# Patient Record
Sex: Female | Born: 1975
Health system: Southern US, Community
[De-identification: ages and names within clinical notes are randomized; demographics above are authoritative.]

## PROBLEM LIST (undated history)

## (undated) DIAGNOSIS — M545 Low back pain, unspecified: Secondary | ICD-10-CM

## (undated) DIAGNOSIS — A630 Anogenital (venereal) warts: Secondary | ICD-10-CM

## (undated) DIAGNOSIS — N200 Calculus of kidney: Secondary | ICD-10-CM

## (undated) DIAGNOSIS — E785 Hyperlipidemia, unspecified: Secondary | ICD-10-CM

## (undated) DIAGNOSIS — T7840XA Allergy, unspecified, initial encounter: Secondary | ICD-10-CM

## (undated) HISTORY — DX: Allergy, unspecified, initial encounter: T78.40XA

## (undated) HISTORY — DX: Low back pain: M54.5

## (undated) HISTORY — DX: Hyperlipidemia, unspecified: E78.5

## (undated) HISTORY — DX: Calculus of kidney: N20.0

## (undated) HISTORY — DX: Low back pain, unspecified: M54.50

## (undated) HISTORY — DX: Anogenital (venereal) warts: A63.0

---

## 1997-10-27 HISTORY — PX: OTHER SURGICAL HISTORY: SHX169

## 1999-05-06 ENCOUNTER — Emergency Department (HOSPITAL_COMMUNITY): Admission: EM | Admit: 1999-05-06 | Discharge: 1999-05-06 | Payer: Self-pay | Admitting: Emergency Medicine

## 2004-09-30 ENCOUNTER — Other Ambulatory Visit: Admission: RE | Admit: 2004-09-30 | Discharge: 2004-09-30 | Payer: Self-pay | Admitting: Obstetrics and Gynecology

## 2004-11-11 ENCOUNTER — Encounter: Admission: RE | Admit: 2004-11-11 | Discharge: 2004-11-11 | Payer: Self-pay | Admitting: Internal Medicine

## 2005-10-14 ENCOUNTER — Other Ambulatory Visit: Admission: RE | Admit: 2005-10-14 | Discharge: 2005-10-14 | Payer: Self-pay | Admitting: Obstetrics and Gynecology

## 2005-10-27 HISTORY — PX: OTHER SURGICAL HISTORY: SHX169

## 2006-08-07 ENCOUNTER — Ambulatory Visit (HOSPITAL_COMMUNITY): Admission: RE | Admit: 2006-08-07 | Discharge: 2006-08-07 | Payer: Self-pay | Admitting: Obstetrics and Gynecology

## 2006-08-07 ENCOUNTER — Encounter (INDEPENDENT_AMBULATORY_CARE_PROVIDER_SITE_OTHER): Payer: Self-pay | Admitting: Specialist

## 2006-12-15 ENCOUNTER — Ambulatory Visit: Admission: RE | Admit: 2006-12-15 | Discharge: 2006-12-15 | Payer: Self-pay | Admitting: Gynecology

## 2007-01-13 ENCOUNTER — Ambulatory Visit (HOSPITAL_BASED_OUTPATIENT_CLINIC_OR_DEPARTMENT_OTHER): Admission: RE | Admit: 2007-01-13 | Discharge: 2007-01-13 | Payer: Self-pay | Admitting: Gynecology

## 2007-02-09 ENCOUNTER — Ambulatory Visit: Admission: RE | Admit: 2007-02-09 | Discharge: 2007-02-09 | Payer: Self-pay | Admitting: Gynecology

## 2007-04-23 ENCOUNTER — Ambulatory Visit: Admission: RE | Admit: 2007-04-23 | Discharge: 2007-04-23 | Payer: Self-pay | Admitting: Gynecology

## 2008-11-13 ENCOUNTER — Ambulatory Visit: Payer: Self-pay | Admitting: Sports Medicine

## 2008-11-13 DIAGNOSIS — M79609 Pain in unspecified limb: Secondary | ICD-10-CM

## 2008-12-18 ENCOUNTER — Ambulatory Visit: Payer: Self-pay | Admitting: Sports Medicine

## 2009-05-18 ENCOUNTER — Ambulatory Visit: Payer: Self-pay | Admitting: Internal Medicine

## 2009-09-06 ENCOUNTER — Ambulatory Visit: Payer: Self-pay | Admitting: Internal Medicine

## 2009-09-25 ENCOUNTER — Ambulatory Visit: Payer: Self-pay | Admitting: Internal Medicine

## 2009-11-29 ENCOUNTER — Ambulatory Visit: Payer: Self-pay | Admitting: Internal Medicine

## 2009-12-21 ENCOUNTER — Ambulatory Visit: Payer: Self-pay | Admitting: Internal Medicine

## 2010-06-10 ENCOUNTER — Ambulatory Visit: Payer: Self-pay | Admitting: Internal Medicine

## 2010-07-25 ENCOUNTER — Ambulatory Visit: Payer: Self-pay | Admitting: Internal Medicine

## 2011-01-21 ENCOUNTER — Other Ambulatory Visit: Payer: Self-pay | Admitting: Internal Medicine

## 2011-01-23 ENCOUNTER — Ambulatory Visit (INDEPENDENT_AMBULATORY_CARE_PROVIDER_SITE_OTHER): Payer: BC Managed Care – PPO | Admitting: Internal Medicine

## 2011-01-23 DIAGNOSIS — K5900 Constipation, unspecified: Secondary | ICD-10-CM

## 2011-01-23 DIAGNOSIS — E785 Hyperlipidemia, unspecified: Secondary | ICD-10-CM

## 2011-03-11 NOTE — Consult Note (Signed)
NAMEJAMARIYA, [name] NO.:  [id number]   MEDICAL RECORD NO.:  [id number]          PATIENT TYPE:  OUT   LOCATION:  GYN                          FACILITY:  [facility]   PHYSICIAN:  De Blanch, M.D.DATE OF BIRTH:  1975/12/27   DATE OF CONSULTATION:  DATE OF DISCHARGE:                                 CONSULTATION   CHIEF COMPLAINT:  Vulvar dysplasia.   INTERVAL HISTORY:  The patient is seen today for continued followup.  She has now had complete healing of her vulvar laser vaporization  initially performed January 13, 2007.  She has not noted any new lesions.  She specifically denies any pruritus  or thickened skin or lumps.   The patient underwent CO2 laser vaporization of the posterior vulva for  severe dysplasia on January 13, 2007.   PAST SURGICAL HISTORY:  Laser vaporization of the vulva; wisdom teeth  extraction.   CURRENT MEDICATIONS:  None.   DRUG ALLERGIES:  None.   SOCIAL HISTORY:  The patient is a first grade teacher.  She does not  smoke.   OBSTETRICAL HISTORY:  Gravida 0.   FAMILY HISTORY:  Negative for gynecologic, breast or colon cancer.   PHYSICAL EXAMINATION:  Weight 175 pounds. Blood pressure 118/68.  GENERAL:  The patient is a healthy young woman in no acute distress.  HEENT:  Negative.  NECK:  Supple without thyromegaly.  LYMPH NODES:  There is no supraclavicular or inguinal adenopathy.  ABDOMEN:  Soft, nontender, no mass, organomegaly, ascites or hernias  noted.  PELVIC EXAM:  EGBUS shows complete healing of the vulvar laser  procedure.  There is no significant scar present. Vagina is otherwise  normal as is the cervix.  Uterus is anterior, normal shape, size,  consistency.  There are no adnexal masses noted.   IMPRESSION:  Excellent healing following laser vaporization for severe  dysplasia of the vulva. There is no evidence for recurrent disease.   PLAN:  I would recommend the patient return to the care of Dr. Normand Sloop  and  would suggest that she have pelvic exams and Pap smears at 6 month  intervals.  She is warned regarding the potential symptoms and that she  should report sooner.      De Blanch, M.D.  Electronically Signed     DC/MEDQ  D:  04/23/2007  T:  04/23/2007  Job:  [id number]   cc:   Naima A. Normand Sloop, M.D.  Fax: [phone]   Telford Nab, R.N.  [address]  [place], [place] [address]

## 2011-03-14 NOTE — H&P (Signed)
NAMESAVITA, RUNNER NO.:  [id number]   MEDICAL RECORD NO.:  [id number]         PATIENT TYPE:  LOUT   LOCATION:                               FACILITY:  [facility]   PHYSICIAN:  De Blanch, M.D.DATE OF BIRTH:  02-27-1976   DATE OF ADMISSION:  DATE OF DISCHARGE:                              HISTORY & PHYSICAL   CHIEF COMPLAINT:  Vulvar dysplasia.   HISTORY:  The patient underwent laser vaporization of a posterior vulvar  lesion on March 19.  She reports she has had no difficulties since that  time.   PERTINENT EXAMINATION:  The vulvar exam shows that the perineal area  that was lasered is about 80% healed.  There is still a section in the  center of the wound that is granulating.  This appears clean, healthy  and with no evidence of infection.   RECOMMENDATIONS:  The patient will continue to keep this area clean;  applying Silvadene cream twice a day.  She is to return to see me in 2  months for follow-up.      De Blanch, M.D.  Electronically Signed     DC/MEDQ  D:  02/09/2007  T:  02/10/2007  Job:  [id number]

## 2011-03-14 NOTE — Op Note (Signed)
[name], [name] NO.:  Savage   MEDICAL RECORD NO.:  [id number]          PATIENT TYPE:  AMB   LOCATION:  SDC                           FACILITY:  [facility]   PHYSICIAN:  Janine Limbo, M.D.DATE OF BIRTH:  [birth year]   DATE OF PROCEDURE:  08/07/2006  DATE OF DISCHARGE:                                 OPERATIVE REPORT   PREOPERATIVE DIAGNOSIS:  1. Vulvar intraepithelial neoplasia II.  2. Perineal condyloma.   POSTOPERATIVE DIAGNOSIS:  1. Vulvar intraepithelial neoplasia II.  2. Perineal condyloma.   PROCEDURE:  1. Resection of perineal vulvar intraepithelial neoplasia II.  2. Laser ablation of condylomata.   SURGEON:  Dr. Leonard Schwartz   ASSISTANT:  None.   ANESTHETIC:  Was general.   DISPOSITION:  [name] is a 35 year old female, gravida 0, who presents  with the above-mentioned diagnoses.  The patient understands the indications  for her surgical procedure and she accepts the risks of, but not limited to,  anesthetic complications, bleeding, infection, and possible damage to  surrounding organs.   FINDINGS:  The perineum in the midline had multiple condylomata and acetyl  white mucosa.  There were condylomata to the left of the rectum and  condylomata to the right of the rectum. There were no condylomata present to  the left of the introitus and there was a small condyloma present to the  right of the urethra. The vagina appeared normal.  The cervix appeared  normal.  The uterus was normal size, shape and consistency and no adnexal  masses were appreciated.   PROCEDURE:  The patient was taken to the operating room where a general  anesthetic was given.  The patient's lower abdomen, perineum, and vagina  were prepped with multiple layers of Betadine.  The bladder was drained of  urine.  Examination under anesthesia was performed.  The patient was then  sterilely draped.  Wet towels were also used to drape the patient.  Acetic  acid was applied to the perineum and the area was carefully inspected.  The  areas of acetowhite epithelium were isolated using Kocher clamps.  The area  was then injected with 10 mL of half percent Marcaine with epinephrine.  The  acetowhite areas were resected sharply.  The Bovie cautery was used for  hemostasis.  The defect was closed using 3-0 chromic.  We then used a  defocused beam of the laser to ablate all other condyloma that were present.  The vagina was carefully inspected and there was no evidence of condylomata  in the vagina or on the cervix.  Instruments were removed.  Hemostasis was  adequate.  A layer of Silvadene cream was applied.  The patient was awakened  from her anesthetic and returned to the supine position.  She was then taken  to the recovery room in stable condition.  Sponge, needle, instrument counts  were correct.  Estimated blood loss was 10 mL.  The resection from the  perineum was sent to pathology after being marked on a cork board at the 12  o'clock, 3 o'clock, 6 o'clock,  and 9 o'clock positions.   FOLLOW-UP INSTRUCTIONS:  The patient was given a prescription for Vicodin  and she will take 1-2 tablets every 4 hours as needed for pain.  She can  also use ibuprofen 600 mg every 6 hours as needed for pain.  The patient  will take a warm bath twice each day with Instant Ocean.  She will blow dry  the perineum with a blow dry.  She will apply of thin layer of Silvadene  cream.  She will use lidocaine gel 2% every 4 hours as needed.  The patient  will return to see Dr. Stefano Gaul in 2-3 weeks for follow-up examination.      Janine Limbo, M.D.  Electronically Signed     AVS/MEDQ  D:  08/07/2006  T:  08/09/2006  Job:  [id number]

## 2011-03-14 NOTE — Op Note (Signed)
NAMELEKITA, KEREKES NO.:  [id number]   MEDICAL RECORD NO.:  [id number]          PATIENT TYPE:  AMB   LOCATION:  [facility]C                         [facility]ACILITY:  [facility]   PHYSICIAN:  De Blanch, M.D.DATE OF BIRTH:  03/01/1976   DATE OF PROCEDURE:  01/13/2007  DATE OF DISCHARGE:                               OPERATIVE REPORT   PREOPERATIVE DIAGNOSIS:  Severe dysplasia of the vulva.   POSTOPERATIVE DIAGNOSIS:  Severe dysplasia of the vulva.   PROCEDURE:  CO2 laser ablation of vulvar lesion.   SURGEON:  De Blanch, M.D.   ANESTHESIA:  LMA/general.   ESTIMATED BLOOD LOSS:  Minimal.   SURGICAL FINDINGS:  After applying acetic acid to the vulva, a very  focal area of white epithelium appeared on the perineum extending nearly  to the anus and to the introitus in the midline.  Overall, the entire  area was approximately 4 x 3 cm.  Acetic acid was also applied in the  vagina and no lesions were noted.   DESCRIPTION OF PROCEDURE:  The patient was brought to the operating room  and after satisfactory attainment of LMA general anesthesia, was placed  in the lithotomy position in Hutchison stirrups.  The vulva was draped with  wet towels and then acetic acid was applied in the vagina and vulva.  The lesion was identified as noted above.  Using a CO2 laser with 20  watts of power in a continuous mode, the entire lesion was ablated to  the second surgical plane.  An approximately 5 mm margin  circumferentially was obtained.  Silvadene cream was applied.  The  patient was awakened from anesthesia and taken to the recovery room in  satisfactory condition.  Sponge, needle, and instrument counts were  correct x2.      De Blanch, M.D.  Electronically Signed     DC/MEDQ  D:  01/13/2007  T:  01/13/2007  Job:  [id number]   cc:   Telford Nab, R.N.  [address]  [place], [place] [address]   Naima A. Normand Sloop, M.D.  Fax: [phone]

## 2011-03-14 NOTE — H&P (Signed)
NAMEJILLENE, [name] NO.:  [id number]   MEDICAL RECORD NO.:  [id number]          PATIENT TYPE:  AMB   LOCATION:  SDC                           FACILITY:  [facility]   PHYSICIAN:  Janine Limbo, M.D.DATE OF BIRTH:  Dec 20, 1975   DATE OF ADMISSION:  08/07/2006  DATE OF DISCHARGE:                                HISTORY & PHYSICAL   HISTORY OF PRESENT ILLNESS:  The patient is a 35 year old female, gravida 0,  who presents for laser ablation of condylomata and resection of vulvar  intraepithelial neoplasia II.  The patient has been followed at the [facility] division of [facility].  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  She wishes immediate therapy.   ALLERGIES:  No known drug allergies.   PAST MEDICAL HISTORY:  The patient had had her wisdom teeth removed.  She  did have a collar bone fracture.   SOCIAL HISTORY:  The patient drinks alcohol socially.  She denies cigarette  use and other recreational drug uses.   REVIEW OF SYSTEMS:  The patient complains of perineal irritation.   FAMILY HISTORY:  Noncontributory.   PHYSICAL EXAMINATION:  VITAL SIGNS:  Weight is 158 pounds, height is 5 feet  7 inches.  HEENT:  Within normal limits.  CHEST:  Clear.  HEART:  Regular rate and rhythm.  BREASTS:  Without masses.  ABDOMEN:  Nontender.  EXTREMITIES:  Grossly normal.  NEUROLOGIC:  Exam is grossly normal.  PELVIC:  External genitalia showed condylomata at the introitus particularly  at the 5 o'clock and 7 o'clock positions.  There is atypical epithelium  present at the posterior fourchette.  The vagina is normal.  Cervix is  nontender.  Uterus is normal size, shape and consistency.  Adnexa have no  masses.   ASSESSMENT:  1. Vulvar intraepithelial neoplasia II.  2. Condylomata.   PLAN:  The patient will undergo a resection of vulvar intraepithelial  neoplasia II  and also laser ablation of condyloma.  She understands the  indications for her procedure and she accepts the risks of, but not limited  to, anesthetic complications, bleeding, infections, and possible damage to  the surrounding organs.      Janine Limbo, M.D.  Electronically Signed     AVS/MEDQ  D:  08/06/2006  T:  08/07/2006  Job:  [id number]

## 2011-03-14 NOTE — Consult Note (Signed)
[name], ENLOE NO.:  [id number]   MEDICAL RECORD NO.:  [id number]          PATIENT TYPE:  OUT   LOCATION:  GYN                          [facility]ACILITY:  [facility]   PHYSICIAN:  De Blanch, M.D.DATE OF BIRTH:  1976-04-29   DATE OF CONSULTATION:  12/15/2006  DATE OF DISCHARGE:                                 CONSULTATION   CHIEF COMPLAINT:  Severe vulvar dysplasia.   HISTORY OF PRESENT ILLNESS:  A 35 year old white female seen in  consultation at the request of Dr. Jaymes Graff regarding recurrent  vulvar intraepithelial neoplasia, grade III.  The patient initially was  found to have a vulvar lesion in October 2007.  At that time, she  underwent a wide local excision and laser vaporization.  Unfortunately  on followup November 25, 2006, she was found to have another lesion in  the posterior fourchette.  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  She also had a low-grade SIL Pap smear.  Colposcopy and  biopsies did not show any dysplasia.   The patient is relatively asymptomatic.   CURRENT MEDICAL ILLNESSES:  None.   DRUG ALLERGIES:  None.   PAST SURGICAL HISTORY:  1. Laser vaporization of the vulva.  2. Wisdom teeth extraction.   SOCIAL HISTORY:  The patient is a first grade teacher.  She does not  smoke.   OBSTETRICAL HISTORY:  Gravida 0.  The patient is not married.   FAMILY HISTORY:  Negative for gynecologic, breast, or colon cancer.   PHYSICAL EXAMINATION:  VITAL SIGNS: Height 5 feet 8 inches.  Weight 168-  1/2 pounds.  Blood pressure 128/68, pulse 68, respiratory rate 18.  GENERAL:  The patient is a healthy white female in no acute distress.  HEENT:  Negative.  NECK:  Supple without thyromegaly.  ADENOPATHY:  There is no supraclavicular or inguinal adenopathy.  ABDOMEN:  Soft, nontender without mass, organomegaly, ascites, or  hernias noted.  PELVIC:  EG/BUS appear essentially normal with some granularity in the  posterior  fourchette.  No pigmented lesions are noted.  Vagina is  otherwise clean as is the cervix.  Uterus is anterior, normal shape,  size, consistency.   PROCEDURE NOTE:  Colposcopic examination is performed of the vulva  following application of acetic acid.  After the acetic acid was  applied, a white lesion was easily identified in the posterior  fourchette occupying most of this region.  This is somewhat raised and  nodular.  This is consistent with severe dysplasia.   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  I  discussed with the patient management options which include wide local  excision with primary closure versus further laser vaporization of this  area.  Medications such as Aldara or 5-FU might be considered, although  I do not think they should be considered as her primary therapy at this  juncture.  The pros and cons of wide excision versus laser vaporization  were discussed.  I believe that wide excision will likely lead to more  scarring and narrowing of her introitus and, therefore, would recommend  that this area be lasered. The patient is in agreement with this plan,  and we will schedule her to undergo laser vaporization of the posterior  fourchette January 13, 2007, as an outpatient.  She is aware of the risks  of surgery and the need for postoperative care, having done this  previously.      De Blanch, M.D.  Electronically Signed     DC/MEDQ  D:  12/15/2006  T:  12/16/2006  Job:  [id number]   cc:   Naima A. Normand Sloop, M.D.  Fax: [phone]   Telford Nab, R.N.  [address]  [place], [place] [address]

## 2011-04-28 ENCOUNTER — Other Ambulatory Visit: Payer: Self-pay | Admitting: Internal Medicine

## 2011-07-21 ENCOUNTER — Encounter: Payer: Self-pay | Admitting: Internal Medicine

## 2011-07-22 ENCOUNTER — Other Ambulatory Visit: Payer: BC Managed Care – PPO | Admitting: Internal Medicine

## 2011-07-22 DIAGNOSIS — Z0000 Encounter for general adult medical examination without abnormal findings: Secondary | ICD-10-CM

## 2011-07-22 DIAGNOSIS — E785 Hyperlipidemia, unspecified: Secondary | ICD-10-CM

## 2011-07-22 LAB — CBC WITH DIFFERENTIAL/PLATELET
Basophils Absolute: 0.1 10*3/uL (ref 0.0–0.1)
Basophils Relative: 1 % (ref 0–1)
Eosinophils Absolute: 0.3 10*3/uL (ref 0.0–0.7)
Hemoglobin: 14.5 g/dL (ref 12.0–15.0)
MCH: 29.8 pg (ref 26.0–34.0)
MCHC: 33.5 g/dL (ref 30.0–36.0)
Monocytes Relative: 7 % (ref 3–12)
Neutrophils Relative %: 69 % (ref 43–77)
Platelets: 292 10*3/uL (ref 150–400)
RDW: 14.2 % (ref 11.5–15.5)

## 2011-07-22 LAB — TSH: TSH: 1.199 u[IU]/mL (ref 0.350–4.500)

## 2011-07-22 LAB — BASIC METABOLIC PANEL
Calcium: 9.3 mg/dL (ref 8.4–10.5)
Glucose, Bld: 86 mg/dL (ref 70–99)
Sodium: 138 mEq/L (ref 135–145)

## 2011-07-22 LAB — HEPATIC FUNCTION PANEL
ALT: 22 U/L (ref 0–35)
AST: 20 U/L (ref 0–37)
Bilirubin, Direct: 0.1 mg/dL (ref 0.0–0.3)
Indirect Bilirubin: 0.4 mg/dL (ref 0.0–0.9)

## 2011-07-22 LAB — LIPID PANEL
Cholesterol: 171 mg/dL (ref 0–200)
Total CHOL/HDL Ratio: 3 Ratio

## 2011-07-24 ENCOUNTER — Other Ambulatory Visit: Payer: Self-pay | Admitting: Internal Medicine

## 2011-07-24 ENCOUNTER — Encounter: Payer: Self-pay | Admitting: Internal Medicine

## 2011-07-24 ENCOUNTER — Ambulatory Visit (INDEPENDENT_AMBULATORY_CARE_PROVIDER_SITE_OTHER): Payer: BC Managed Care – PPO | Admitting: Internal Medicine

## 2011-07-24 VITALS — BP 116/86 | HR 80 | Temp 99.0°F | Ht <= 58 in | Wt 217.0 lb

## 2011-07-24 DIAGNOSIS — R109 Unspecified abdominal pain: Secondary | ICD-10-CM

## 2011-07-24 DIAGNOSIS — J309 Allergic rhinitis, unspecified: Secondary | ICD-10-CM

## 2011-07-24 DIAGNOSIS — Z87442 Personal history of urinary calculi: Secondary | ICD-10-CM

## 2011-07-24 DIAGNOSIS — M545 Low back pain, unspecified: Secondary | ICD-10-CM

## 2011-07-24 DIAGNOSIS — E785 Hyperlipidemia, unspecified: Secondary | ICD-10-CM

## 2011-07-24 DIAGNOSIS — Z8619 Personal history of other infectious and parasitic diseases: Secondary | ICD-10-CM | POA: Insufficient documentation

## 2011-07-24 DIAGNOSIS — Z0000 Encounter for general adult medical examination without abnormal findings: Secondary | ICD-10-CM

## 2011-07-24 DIAGNOSIS — E559 Vitamin D deficiency, unspecified: Secondary | ICD-10-CM | POA: Insufficient documentation

## 2011-07-24 LAB — POCT URINALYSIS DIPSTICK
Bilirubin, UA: NEGATIVE
Glucose, UA: NEGATIVE
Spec Grav, UA: 1.03

## 2011-07-24 NOTE — Progress Notes (Signed)
  Subjective:    Patient ID: [name], female    DOB: 1976-07-11, 35 y.o.   MRN: [id number]  HPI  35 year old white female for health maintenance exam and evaluation of medical problems. Has had recent URI symptoms with malaise fatigue and myalgias. Her GYN physician gave her Zithromax Z-Pak just a few days ago. She slowly responding. 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 A genital wart removed 2007 in 2008. Urinary tract infection 2005. 4 wisdom teeth removed 1999. Tetanus immunization 2005. Also is complaining of episodes of right flank pain associated with nausea. This has happened a couple of times most recently a few days ago. She is concerned about gallbladder disease. She is overweight. Plan to do ultrasound of gallbladder October 31.    Review of Systems  Constitutional: Negative.   HENT: Negative.   Eyes: Negative.   Respiratory: Negative.   Cardiovascular: Negative.   Gastrointestinal:       Nausea associated with right flank pain  Genitourinary: Negative.   Musculoskeletal: Positive for back pain.  Neurological: Negative.   Hematological: Negative.   Psychiatric/Behavioral: Negative.        Objective:   Physical Exam  Constitutional: She is oriented to person, place, and time.  HENT:  Head: Normocephalic and atraumatic.  Right Ear: External ear normal.  Left Ear: External ear normal.  Mouth/Throat: Oropharynx is clear and moist. No oropharyngeal exudate.  Eyes: EOM are normal. Pupils are equal, round, and reactive to light. No scleral icterus.  Neck: Neck supple. No thyromegaly present.  Cardiovascular: Normal rate, regular rhythm and normal heart sounds.   No murmur heard. Pulmonary/Chest: Effort normal and breath sounds normal. She has no wheezes. She has no rales.  Abdominal: Soft. Bowel sounds are normal. She exhibits no distension and no mass. There is no tenderness. There  is no rebound and no guarding.  Genitourinary:       Deferred to GYN  Musculoskeletal: She exhibits no edema.  Lymphadenopathy:    She has no cervical adenopathy.  Neurological: She is alert and oriented to person, place, and time. She has normal reflexes. No cranial nerve deficit.  Skin: Skin is warm and dry.          Assessment & Plan:  Hyperlipidemia treated with statin therapy  Allergic rhinitis  Low back pain  Vitamin D deficiency  History of HPV/genital warts  History of kidney stones  History of at least 2 episodes of flank pain associated with nausea but no vomiting-possible cholelithiasis. This was not associated with dysuria or groin pain.  Patient will have ultrasound gallbladder and pancreas on October 31. Will get influenza immunization through her employment. Return in 6 months for fasting lipid panel liver functions and office visit

## 2011-07-30 ENCOUNTER — Ambulatory Visit
Admission: RE | Admit: 2011-07-30 | Discharge: 2011-07-30 | Disposition: A | Discharge status: Home / Self Care | Payer: BC Managed Care – PPO | Source: Ambulatory Visit | Attending: Internal Medicine | Admitting: Internal Medicine

## 2011-07-30 DIAGNOSIS — R109 Unspecified abdominal pain: Secondary | ICD-10-CM

## 2011-07-31 ENCOUNTER — Encounter: Payer: Self-pay | Admitting: Internal Medicine

## 2011-08-01 ENCOUNTER — Telehealth: Payer: Self-pay

## 2011-08-19 ENCOUNTER — Other Ambulatory Visit: Payer: Self-pay | Admitting: Internal Medicine

## 2011-08-20 ENCOUNTER — Other Ambulatory Visit: Payer: Self-pay | Admitting: Internal Medicine

## 2011-08-20 ENCOUNTER — Other Ambulatory Visit: Payer: Self-pay

## 2011-08-20 MED ORDER — SIMVASTATIN 20 MG PO TABS: 20.0000 mg | ORAL_TABLET | Freq: Every day | ORAL | Status: DC

## 2011-08-20 NOTE — Telephone Encounter (Signed)
Duplicate rx request from pharmacy

## 2011-08-22 NOTE — Telephone Encounter (Signed)
rx refilled.

## 2011-10-24 ENCOUNTER — Ambulatory Visit (INDEPENDENT_AMBULATORY_CARE_PROVIDER_SITE_OTHER): Payer: BC Managed Care – PPO | Admitting: Internal Medicine

## 2011-10-24 ENCOUNTER — Encounter: Payer: Self-pay | Admitting: Internal Medicine

## 2011-10-24 VITALS — BP 100/78 | HR 96 | Temp 101.1°F | Ht 66.75 in | Wt 220.0 lb

## 2011-10-24 DIAGNOSIS — H6693 Otitis media, unspecified, bilateral: Secondary | ICD-10-CM

## 2011-10-24 DIAGNOSIS — J029 Acute pharyngitis, unspecified: Secondary | ICD-10-CM

## 2011-10-24 DIAGNOSIS — J111 Influenza due to unidentified influenza virus with other respiratory manifestations: Secondary | ICD-10-CM

## 2011-10-24 DIAGNOSIS — H6690 Otitis media, unspecified, unspecified ear: Secondary | ICD-10-CM

## 2011-10-24 DIAGNOSIS — J329 Chronic sinusitis, unspecified: Secondary | ICD-10-CM

## 2011-10-24 NOTE — Patient Instructions (Signed)
Take Tamiflu twice daily for 5 days. Take Biaxin 500 mg twice daily with meals for 10 days. Use Hycodan as needed for sore throat or cough. Call if not better next week or sooner if course.

## 2011-10-24 NOTE — Progress Notes (Signed)
  Subjective:    Patient ID: [name], female    DOB: April 30, 1976, 35 y.o.   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 Has no nausea, no diarrhea, no myalgias. Has fever. Symptoms initially started 3 days ago with runny nose and coryza. 48 hours ago began to have sore throat. Yesterday began with chills and fever. Did have influenza immunization previously. Is planning to go to [place] for a football game tomorrow. Has malaise and fatigue. Had near syncope last night trying to go to the bathroom.    Review of Systems     Objective:   Physical Exam HEENT exam: Pharynx is red, rapid strep screen is negative. No exudate. TMs are full bilaterally with splayed light reflex. Neck is supple without significant adenopathy. Chest is clear.        Assessment & Plan:  Probable influenza  Bilateral otitis media  Sinusitis  Plan: Tamiflu 75 mg twice daily for 5 days. Biaxin 500 mg twice daily for 10 days. Hycodan syrup ( 8 ounces) 1 teaspoon every 6 hours as needed for cough and sore throat pain. Suggest patient not travel to [place] as she is likely contagious. Call if not better next week or sooner if worse.

## 2011-12-01 ENCOUNTER — Telehealth: Payer: Self-pay | Admitting: Internal Medicine

## 2011-12-01 NOTE — Telephone Encounter (Signed)
Yes and for OV.

## 2011-12-02 ENCOUNTER — Encounter: Payer: Self-pay | Admitting: Internal Medicine

## 2011-12-02 ENCOUNTER — Ambulatory Visit (INDEPENDENT_AMBULATORY_CARE_PROVIDER_SITE_OTHER): Payer: BC Managed Care – PPO | Admitting: Internal Medicine

## 2011-12-02 VITALS — BP 110/78 | HR 80 | Temp 99.0°F | Wt 214.0 lb

## 2011-12-02 DIAGNOSIS — N23 Unspecified renal colic: Secondary | ICD-10-CM

## 2011-12-02 DIAGNOSIS — J309 Allergic rhinitis, unspecified: Secondary | ICD-10-CM

## 2011-12-02 DIAGNOSIS — E785 Hyperlipidemia, unspecified: Secondary | ICD-10-CM

## 2011-12-02 DIAGNOSIS — M519 Unspecified thoracic, thoracolumbar and lumbosacral intervertebral disc disorder: Secondary | ICD-10-CM

## 2011-12-02 DIAGNOSIS — Z8669 Personal history of other diseases of the nervous system and sense organs: Secondary | ICD-10-CM

## 2011-12-02 DIAGNOSIS — N200 Calculus of kidney: Secondary | ICD-10-CM

## 2011-12-02 DIAGNOSIS — E669 Obesity, unspecified: Secondary | ICD-10-CM

## 2011-12-02 DIAGNOSIS — Z8619 Personal history of other infectious and parasitic diseases: Secondary | ICD-10-CM

## 2011-12-02 DIAGNOSIS — K219 Gastro-esophageal reflux disease without esophagitis: Secondary | ICD-10-CM

## 2011-12-02 LAB — POCT URINALYSIS DIPSTICK
Bilirubin, UA: NEGATIVE
Glucose, UA: NEGATIVE
Ketones, UA: NEGATIVE
Leukocytes, UA: NEGATIVE

## 2011-12-03 NOTE — Patient Instructions (Signed)
Patient scheduled for an appointment with Dr. Vernie Ammons on  12/25/2011 at 8:30 am. Informed of this appointment. Will send notes.

## 2011-12-08 LAB — STONE ANALYSIS

## 2011-12-14 ENCOUNTER — Encounter: Payer: Self-pay | Admitting: Internal Medicine

## 2011-12-15 ENCOUNTER — Telehealth: Payer: Self-pay | Admitting: Internal Medicine

## 2011-12-15 DIAGNOSIS — M539 Dorsopathy, unspecified: Secondary | ICD-10-CM

## 2011-12-15 MED ORDER — CYCLOBENZAPRINE HCL 10 MG PO TABS: 10.0000 mg | ORAL_TABLET | Freq: Three times a day (TID) | ORAL | Status: DC | PRN

## 2011-12-15 NOTE — Telephone Encounter (Signed)
Rx for Flexeril 10 mg tid prn e- rxed to [facility]. Patient informed

## 2011-12-15 NOTE — Telephone Encounter (Signed)
Call in Flexeril 10 mg #30 one po tid with prn one year refills.

## 2011-12-21 DIAGNOSIS — Z8669 Personal history of other diseases of the nervous system and sense organs: Secondary | ICD-10-CM | POA: Insufficient documentation

## 2011-12-21 DIAGNOSIS — M519 Unspecified thoracic, thoracolumbar and lumbosacral intervertebral disc disorder: Secondary | ICD-10-CM | POA: Insufficient documentation

## 2011-12-21 NOTE — Progress Notes (Signed)
  Subjective:    Patient ID: [name], female    DOB: 1976-09-06, 36 y.o.   MRN: [id number]  HPI  36 year old white female who went to the emergency department 07/30/2011 with flank pain. Had an ultrasound that showed possible 8 mm stone in right kidney and possibly to stones each 0.5 mm in left kidney. Patient had episode of right flank pain and groin pain February 2 and passed a small stone. Pain lasted 15 minutes to half-an-hour. She brings stone in today for analysis.  Patient had history of lumbar disc at L4-L5 and eccentric to the right 2006 seen by Dr. Trey Sailors with minimal radicular findings. He placed her on Celebrex and she improved.  History of allergic rhinitis was skin test positive to grass pollens, tree pollens, cat, and some molds  History of recurrent ear pain. Saw ENT physician Dr. Pollyann Kennedy June 2009. Had oral candidiasis secondary to prednisone use. History of TMJ issues. He did not feel at that time that serous otitis would be causing pain.  History of hyperlipidemia treated with Zocor. History of low back pain treated with Flexeril. Urinary tract infection November 2005 prior to my assuming her care. Had genital warts removed 2007 in 2008 for HPV. Wisdom teeth removed by Dr. Leda Quail 1999. Tetanus immunization July 2005. GYN exam done by [facility]. History of migraine headache.  History of GE reflux.  LS-spine series done in 2006 indicates patient likely had bilateral kidney stones at that time.  Family history: Mother with history of hypertension hyperlipidemia adult onset diabetes. One brother in good health. Both parents overweight. Father with history of MI. No family history of kidney stones.  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 She is single never married. Does not smoke. Social alcohol consumption. Drinks wine and beer. Patient is overweight. Doesn't exercise very  much.    Review of Systems     Objective:   Physical Exam No CVA tenderness. Urinalysis unremarkable.       Assessment & Plan:  Right renal colic-passed stone which was sent for analysis.  Plan: Patient needs evaluation by urologist. Appointment made February 28 with Dr. Vernie Ammons.  Addendum: Stone analysis shows calcium oxalate. See report.

## 2012-08-31 ENCOUNTER — Other Ambulatory Visit: Payer: Self-pay | Admitting: Obstetrics and Gynecology

## 2012-08-31 ENCOUNTER — Other Ambulatory Visit: Payer: Self-pay | Admitting: Internal Medicine

## 2012-09-01 NOTE — Telephone Encounter (Addendum)
ND to address.  Pt may have a refill of folic acid.

## 2012-09-08 ENCOUNTER — Encounter: Payer: Self-pay | Admitting: Obstetrics and Gynecology

## 2012-09-08 ENCOUNTER — Ambulatory Visit (INDEPENDENT_AMBULATORY_CARE_PROVIDER_SITE_OTHER): Payer: BC Managed Care – PPO | Admitting: Obstetrics and Gynecology

## 2012-09-08 VITALS — BP 104/76 | HR 80 | Resp 16 | Ht 67.5 in | Wt 232.0 lb

## 2012-09-08 DIAGNOSIS — Z124 Encounter for screening for malignant neoplasm of cervix: Secondary | ICD-10-CM

## 2012-09-08 MED ORDER — FOLIC ACID 1 MG PO TABS: 1.0000 mg | ORAL_TABLET | Freq: Every day | ORAL | Status: AC

## 2012-09-08 NOTE — Progress Notes (Signed)
Last Pap: 07/22/2011 WNL: Yes Regular Periods:yes Contraception: None  Monthly Breast exam:no Tetanus<96yrs:yes Nl.Bladder Function:yes Daily BMs:yes Healthy Diet:yes Calcium:no Mammogram:no Date of Mammogram: Never Exercise:no Have often Exercise: Never Seatbelt: yes Abuse at home: no Stressful work:yes Sigmoid-colonoscopy: Never per pt  Bone Density: No PCP: Dr.Baxley Change in PMH: No changes per pt Change in FMH:No Changes per pt  BP 104/76  Pulse 80  Resp 16  Ht 5' 7.5" (1.715 m)  Wt 232 lb (105.235 kg)  BMI 35.80 kg/m2  LMP 08/09/2012 Pt with complaints:no Physical Examination: General appearance - alert, well appearing, and in no distress Mental status - normal mood, behavior, speech, dress, motor activity, and thought processes Neck - supple, no significant adenopathy,  thyroid exam: thyroid is normal in size without nodules or tenderness Chest - clear to auscultation, no wheezes, rales or rhonchi, symmetric air entry Heart - normal rate and regular rhythm Abdomen - soft, nontender, nondistended, no masses or organomegaly Breasts - breasts appear normal, no suspicious masses, no skin or nipple changes or axillary nodes Pelvic - normal external genitalia, vulva, vagina, cervix, uterus and adnexa Rectal - rectal exam not indicated Back exam - full range of motion, no tenderness, palpable spasm or pain on motion Neurological - alert, oriented, normal speech, no focal findings or movement disorder noted Musculoskeletal - no joint tenderness, deformity or swelling Extremities - no edema, redness or tenderness in the calves or thighs Skin - normal coloration and turgor, no rashes, no suspicious skin lesions noted Routine exam Pap sent yes Mammogram due no Natural Family Planning used for contraception RT 1 yr

## 2012-09-09 LAB — PAP IG W/ RFLX HPV ASCU

## 2012-09-13 ENCOUNTER — Other Ambulatory Visit: Payer: Self-pay | Admitting: Internal Medicine

## 2012-10-01 ENCOUNTER — Other Ambulatory Visit: Payer: BC Managed Care – PPO | Admitting: Internal Medicine

## 2012-10-01 DIAGNOSIS — Z79899 Other long term (current) drug therapy: Secondary | ICD-10-CM

## 2012-10-01 DIAGNOSIS — E785 Hyperlipidemia, unspecified: Secondary | ICD-10-CM

## 2012-10-01 DIAGNOSIS — Z0000 Encounter for general adult medical examination without abnormal findings: Secondary | ICD-10-CM

## 2012-10-01 LAB — CBC WITH DIFFERENTIAL/PLATELET
Eosinophils Absolute: 0.1 10*3/uL (ref 0.0–0.7)
Eosinophils Relative: 2 % (ref 0–5)
Lymphs Abs: 1.9 10*3/uL (ref 0.7–4.0)
MCH: 29.1 pg (ref 26.0–34.0)
MCV: 86.8 fL (ref 78.0–100.0)
Platelets: 342 10*3/uL (ref 150–400)
RBC: 4.84 MIL/uL (ref 3.87–5.11)
RDW: 14.2 % (ref 11.5–15.5)

## 2012-10-01 LAB — LIPID PANEL
Cholesterol: 188 mg/dL (ref 0–200)
HDL: 64 mg/dL (ref >39)
Total CHOL/HDL Ratio: 2.9 Ratio
Triglycerides: 84 mg/dL (ref <150)
VLDL: 17 mg/dL (ref 0–40)

## 2012-10-01 LAB — COMPREHENSIVE METABOLIC PANEL
ALT: 20 U/L (ref 0–35)
BUN: 13 mg/dL (ref 6–23)
CO2: 25 mEq/L (ref 19–32)
Creat: 0.83 mg/dL (ref 0.50–1.10)
Total Bilirubin: 0.5 mg/dL (ref 0.3–1.2)

## 2012-10-02 LAB — VITAMIN D 25 HYDROXY (VIT D DEFICIENCY, FRACTURES): Vit D, 25-Hydroxy: 41 ng/mL (ref 30–89)

## 2012-10-04 ENCOUNTER — Ambulatory Visit (INDEPENDENT_AMBULATORY_CARE_PROVIDER_SITE_OTHER): Payer: BC Managed Care – PPO | Admitting: Internal Medicine

## 2012-10-04 ENCOUNTER — Encounter: Payer: Self-pay | Admitting: Internal Medicine

## 2012-10-04 VITALS — BP 114/82 | HR 76 | Temp 99.1°F | Ht 67.5 in | Wt 232.0 lb

## 2012-10-04 DIAGNOSIS — M549 Dorsalgia, unspecified: Secondary | ICD-10-CM

## 2012-10-04 DIAGNOSIS — Z8639 Personal history of other endocrine, nutritional and metabolic disease: Secondary | ICD-10-CM

## 2012-10-04 DIAGNOSIS — Z0000 Encounter for general adult medical examination without abnormal findings: Secondary | ICD-10-CM

## 2012-10-04 DIAGNOSIS — E669 Obesity, unspecified: Secondary | ICD-10-CM

## 2012-10-04 DIAGNOSIS — E785 Hyperlipidemia, unspecified: Secondary | ICD-10-CM

## 2012-10-04 DIAGNOSIS — Z8619 Personal history of other infectious and parasitic diseases: Secondary | ICD-10-CM

## 2012-10-04 DIAGNOSIS — Z87442 Personal history of urinary calculi: Secondary | ICD-10-CM

## 2012-10-04 DIAGNOSIS — Z23 Encounter for immunization: Secondary | ICD-10-CM

## 2012-10-04 LAB — POCT URINALYSIS DIPSTICK
Bilirubin, UA: NEGATIVE
Ketones, UA: NEGATIVE
Spec Grav, UA: 1.025
pH, UA: 6

## 2012-10-04 MED ORDER — CYCLOBENZAPRINE HCL 10 MG PO TABS: 10.0000 mg | ORAL_TABLET | Freq: Three times a day (TID) | ORAL | Status: DC | PRN

## 2012-10-04 MED ORDER — SIMVASTATIN 20 MG PO TABS: 20.0000 mg | ORAL_TABLET | Freq: Every day | ORAL | Status: DC

## 2013-01-24 NOTE — Progress Notes (Signed)
  Subjective:    Patient ID: [name], female    DOB: [birth year], 37 y.o.   MRN: [id number]  HPI and 37 year old white female for health maintenance in today wishing medical problems. 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 History of genital wart removal 2007 and 2008. Urinary tract infection 2005. 4 wisdom teeth removed 1999. Tetanus immunization 2005. Patient is overweight. Doesn't follow a strict low fat diet despite being on statin medication.  History of low back pain with right L4-L5 disc on MRI in 2006. Had facet arthropathy at L5-S1.  Has had colposcopy by GYN physician for history of HPV infection.  Sexually active. Not on birth control.  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 Has a college degree. Single never married. Does not smoke. 2-4 alcoholic drinks a week. One brother. Parents are living.  Family history: Both parents are overweight. Mother with history of skin cancer and diabetes mellitus as well as hypertension. Father with history of MI. Mother is retired Charity fundraiser. Father is retired Emergency planning/management officer.  Patient has history of GE reflux and has taken Prevacid in the past.  Allergy testing done in 2009 showed positive allergy tested grass pollens tree pollens cat and some molds    Review of Systems  Constitutional: Negative.   HENT: Negative.   Eyes: Negative.   Respiratory: Negative.   Cardiovascular: Negative.   Gastrointestinal: Negative.   Endocrine: Negative.   Genitourinary: Negative.   Allergic/Immunologic: Positive for environmental allergies.  Neurological: Negative.   Hematological: Negative.   Psychiatric/Behavioral: Negative.        Objective:   Physical Exam  Vitals reviewed. Constitutional: She is oriented to person, place, and time. She appears well-developed and well-nourished. No distress.  HENT:  Head:  Normocephalic.  Right Ear: External ear normal.  Left Ear: External ear normal.  Nose: Nose normal.  Mouth/Throat: Oropharynx is clear and moist. No oropharyngeal exudate.  Eyes: Conjunctivae are normal. Pupils are equal, round, and reactive to light. Right eye exhibits no discharge. Left eye exhibits no discharge. No scleral icterus.  Neck: Neck supple. No JVD present. No thyromegaly present.  Cardiovascular: Normal rate, regular rhythm, normal heart sounds and intact distal pulses.   No murmur heard. Pulmonary/Chest: Effort normal and breath sounds normal. She has no wheezes. She has no rales.  Breasts normal female  Abdominal: Soft. Bowel sounds are normal. She exhibits no distension and no mass. There is no tenderness. There is no rebound and no guarding.  Genitourinary:  Deferred to GYN  Musculoskeletal: She exhibits no edema.  Lymphadenopathy:    She has no cervical adenopathy.  Neurological: She is alert and oriented to person, place, and time. She has normal reflexes. No cranial nerve deficit. Coordination normal.  Skin: Skin is warm and dry. No rash noted. She is not diaphoretic.  Psychiatric: Her behavior is normal. Judgment and thought content normal.          Assessment & Plan:  Normal health maintenance exam  History of HPV infection  History of vitamin D deficiency  Hyperlipidemia  History of kidney stones  History of low back pain secondary to disc disease  Plan: Return in one year or as needed. Statin medication. Does not want to be on oral contraceptives.

## 2013-01-24 NOTE — Patient Instructions (Addendum)
Continue same medications and return in one year. 

## 2013-05-13 ENCOUNTER — Encounter: Payer: Self-pay | Admitting: Internal Medicine

## 2013-05-13 ENCOUNTER — Ambulatory Visit
Admission: RE | Admit: 2013-05-13 | Discharge: 2013-05-13 | Disposition: A | Discharge status: Home / Self Care | Payer: BC Managed Care – PPO | Source: Ambulatory Visit | Attending: Internal Medicine | Admitting: Internal Medicine

## 2013-05-13 ENCOUNTER — Telehealth: Payer: Self-pay | Admitting: Internal Medicine

## 2013-05-13 ENCOUNTER — Ambulatory Visit (INDEPENDENT_AMBULATORY_CARE_PROVIDER_SITE_OTHER): Payer: BC Managed Care – PPO | Admitting: Internal Medicine

## 2013-05-13 VITALS — BP 110/82 | HR 84 | Temp 98.5°F | Wt 235.0 lb

## 2013-05-13 DIAGNOSIS — R1031 Right lower quadrant pain: Secondary | ICD-10-CM

## 2013-05-13 LAB — COMPREHENSIVE METABOLIC PANEL
ALT: 22 U/L (ref 0–35)
AST: 18 U/L (ref 0–37)
Albumin: 4.5 g/dL (ref 3.5–5.2)
Alkaline Phosphatase: 74 U/L (ref 39–117)
Calcium: 9.9 mg/dL (ref 8.4–10.5)
Chloride: 102 mEq/L (ref 96–112)
Potassium: 4.2 mEq/L (ref 3.5–5.3)

## 2013-05-13 LAB — POCT URINALYSIS DIPSTICK
Blood, UA: NEGATIVE
Glucose, UA: NEGATIVE
Ketones, UA: NEGATIVE
Spec Grav, UA: 1.01
Urobilinogen, UA: NEGATIVE

## 2013-05-13 LAB — CBC WITH DIFFERENTIAL/PLATELET
HCT: 45.7 % (ref 36.0–46.0)
Hemoglobin: 15 g/dL (ref 12.0–15.0)
Lymphs Abs: 2 10*3/uL (ref 0.7–4.0)
MCH: 28.5 pg (ref 26.0–34.0)
MCHC: 32.7 g/dL (ref 30.0–36.0)
MCV: 87 fL (ref 78.0–100.0)
Monocytes Absolute: 0.3 10*3/uL (ref 0.1–1.0)
Monocytes Relative: 4 % (ref 3–12)
RBC: 5.26 MIL/uL — ABNORMAL HIGH (ref 3.87–5.11)

## 2013-05-13 MED ORDER — IOHEXOL 300 MG/ML  SOLN
125.0000 mL | Freq: Once | INTRAMUSCULAR | Status: AC | PRN
  Administered 2013-05-13: 125 mL via INTRAVENOUS

## 2013-05-13 NOTE — Telephone Encounter (Signed)
Patient advised to return to office following scan this afternoon.

## 2013-07-11 ENCOUNTER — Other Ambulatory Visit: Payer: Self-pay | Admitting: Internal Medicine

## 2013-07-12 ENCOUNTER — Other Ambulatory Visit: Payer: Self-pay

## 2013-10-06 ENCOUNTER — Other Ambulatory Visit: Payer: BC Managed Care – PPO | Admitting: Internal Medicine

## 2013-10-06 DIAGNOSIS — E559 Vitamin D deficiency, unspecified: Secondary | ICD-10-CM

## 2013-10-06 DIAGNOSIS — E785 Hyperlipidemia, unspecified: Secondary | ICD-10-CM

## 2013-10-06 DIAGNOSIS — Z1329 Encounter for screening for other suspected endocrine disorder: Secondary | ICD-10-CM

## 2013-10-06 DIAGNOSIS — Z87442 Personal history of urinary calculi: Secondary | ICD-10-CM

## 2013-10-06 LAB — CBC WITH DIFFERENTIAL/PLATELET
HCT: 42.2 % (ref 36.0–46.0)
Hemoglobin: 14.5 g/dL (ref 12.0–15.0)
Lymphocytes Relative: 27 % (ref 12–46)
Lymphs Abs: 2 10*3/uL (ref 0.7–4.0)
MCHC: 34.4 g/dL (ref 30.0–36.0)
MCV: 84.9 fL (ref 78.0–100.0)
Monocytes Absolute: 0.6 10*3/uL (ref 0.1–1.0)
Monocytes Relative: 8 % (ref 3–12)
Neutro Abs: 4.8 10*3/uL (ref 1.7–7.7)
RBC: 4.97 MIL/uL (ref 3.87–5.11)
WBC: 7.6 10*3/uL (ref 4.0–10.5)

## 2013-10-06 LAB — TSH: TSH: 1.191 u[IU]/mL (ref 0.350–4.500)

## 2013-10-06 LAB — LIPID PANEL
Cholesterol: 176 mg/dL (ref 0–200)
HDL: 63 mg/dL (ref >39)
Triglycerides: 111 mg/dL (ref <150)

## 2013-10-06 LAB — COMPREHENSIVE METABOLIC PANEL
Albumin: 4 g/dL (ref 3.5–5.2)
Alkaline Phosphatase: 65 U/L (ref 39–117)
BUN: 15 mg/dL (ref 6–23)
CO2: 26 mEq/L (ref 19–32)
Calcium: 9.6 mg/dL (ref 8.4–10.5)
Glucose, Bld: 80 mg/dL (ref 70–99)
Potassium: 4.6 mEq/L (ref 3.5–5.3)
Sodium: 139 mEq/L (ref 135–145)
Total Protein: 6.7 g/dL (ref 6.0–8.3)

## 2013-10-07 ENCOUNTER — Encounter: Payer: Self-pay | Admitting: Internal Medicine

## 2013-10-07 ENCOUNTER — Ambulatory Visit (INDEPENDENT_AMBULATORY_CARE_PROVIDER_SITE_OTHER): Payer: BC Managed Care – PPO | Admitting: Internal Medicine

## 2013-10-07 VITALS — BP 108/80 | HR 92 | Temp 99.0°F | Ht 67.5 in | Wt 224.0 lb

## 2013-10-07 DIAGNOSIS — Z87442 Personal history of urinary calculi: Secondary | ICD-10-CM

## 2013-10-07 DIAGNOSIS — Z8619 Personal history of other infectious and parasitic diseases: Secondary | ICD-10-CM

## 2013-10-07 DIAGNOSIS — E785 Hyperlipidemia, unspecified: Secondary | ICD-10-CM

## 2013-10-07 DIAGNOSIS — Z8639 Personal history of other endocrine, nutritional and metabolic disease: Secondary | ICD-10-CM

## 2013-10-07 DIAGNOSIS — Z0000 Encounter for general adult medical examination without abnormal findings: Secondary | ICD-10-CM

## 2013-10-07 DIAGNOSIS — R7989 Other specified abnormal findings of blood chemistry: Secondary | ICD-10-CM

## 2013-10-07 DIAGNOSIS — E669 Obesity, unspecified: Secondary | ICD-10-CM

## 2013-10-07 DIAGNOSIS — J309 Allergic rhinitis, unspecified: Secondary | ICD-10-CM

## 2013-10-07 DIAGNOSIS — Z8739 Personal history of other diseases of the musculoskeletal system and connective tissue: Secondary | ICD-10-CM

## 2013-10-07 LAB — VITAMIN D 25 HYDROXY (VIT D DEFICIENCY, FRACTURES): Vit D, 25-Hydroxy: 43 ng/mL (ref 30–89)

## 2013-10-07 MED ORDER — SIMVASTATIN 20 MG PO TABS: 20.0000 mg | ORAL_TABLET | Freq: Every day | ORAL | Status: DC

## 2013-10-07 NOTE — Progress Notes (Signed)
   Subjective:    Patient ID: [name], female    DOB: 1976-07-17, 37 y.o.   MRN: [id number]  HPI 37 year old white female in today for health maintenance and evaluation of medical issues. 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  Past medical history: Genital removal 2007 in 2008. Urinary tract infection 2005. For wisdom teeth removed 1999. Tetanus immunization 2005. Is on statin medication for hyperlipidemia. Had colposcopy by GYN physician for history of HPV infection. History of low back pain with right L4-L5 disc on MRI in 2006. Had facet arthropathy at L5-S1. Patient has history of GE reflux and is taking Prevacid in the past. Allergy testing done in 2009 showed positive allergy tests to grass pollens, tree pollens, cat and some molds.  Sexually active. Not on birth control. Dr. Normand Sloop is GYN physician.  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 Single, never married. Does not smoke. 2-4 alcoholic drinks per week. One brother. Parents are living. Mother is retired Designer, jewellery. Father is a retired Emergency planning/management officer.  Family history: Both parents are overweight. Mother with history of skin cancer, diabetes and hypertension. Father with history of MI.    Review of Systems  Constitutional: Negative.   All other systems reviewed and are negative.      Objective:   Physical Exam  Vitals reviewed. Constitutional: She is oriented to person, place, and time. She appears well-developed and well-nourished. No distress.  HENT:  Head: Normocephalic and atraumatic.  Right Ear: External ear normal.  Left Ear: External ear normal.  Mouth/Throat: Oropharynx is clear and moist. No oropharyngeal exudate.  Eyes: Conjunctivae are normal. Pupils are equal, round, and reactive to light. Right eye exhibits no discharge. Left eye exhibits no  discharge. No scleral icterus.  Neck: Neck supple. No JVD present. No thyromegaly present.  Cardiovascular: Normal rate, regular rhythm, normal heart sounds and intact distal pulses.   No murmur heard. Pulmonary/Chest: Effort normal and breath sounds normal. No respiratory distress. She has no wheezes. She has no rales. She exhibits no tenderness.  Breasts normal female  Abdominal: Soft. Bowel sounds are normal. She exhibits no distension and no mass. There is no tenderness. There is no rebound and no guarding.  Genitourinary:  Deferred GYN exam to Dr. Normand Sloop  Musculoskeletal: Normal range of motion. She exhibits no edema.  Lymphadenopathy:    She has no cervical adenopathy.  Neurological: She is alert and oriented to person, place, and time. She has normal reflexes. No cranial nerve deficit. Coordination normal.  Skin: Skin is warm and dry. No rash noted. She is not diaphoretic. No erythema. No pallor.  Psychiatric: She has a normal mood and affect. Her behavior is normal. Judgment and thought content normal.          Assessment & Plan:  Hyperlipidemia-stable on statin therapy. Slight elevation in SGPT likely due to fatty liver. Recheck in 6 months.  History of GE reflux  History of low back pain and lumbar disc disease  History of HPV infection  History of vitamin D deficiency  History of allergic rhinitis  History of kidney stones  Plan: Return in 6 months for office visit lipid panel liver functions. Diet and exercise recommended.

## 2013-10-07 NOTE — Patient Instructions (Signed)
Continue same meds and return in 6 months 

## 2013-10-24 ENCOUNTER — Other Ambulatory Visit: Payer: Self-pay | Admitting: Obstetrics and Gynecology

## 2013-11-26 ENCOUNTER — Encounter: Payer: Self-pay | Admitting: Internal Medicine

## 2013-11-26 NOTE — Progress Notes (Signed)
   Subjective:    Patient ID: [name], female    DOB: July 23, 1976, 38 y.o.   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 Noted 3 episodes of rectal bleeding. However had been consuming beet drinks. No nausea and vomiting. No fever or chills. No significant travel history.    Review of Systems     Objective:   Physical Exam  chest is clear to auscultation. Skin is warm and dry. Abdomen: Bowel sounds are active no hepatosplenomegaly masses but has mild right lower quadrant tenderness without rebound. No stool to guaiac. No evidence of rectal bleeding at the present time       Assessment & Plan:  Right lower quadrant pain with presumed rectal bleeding-? Inflammatory bowel disease, possible diverticulitis  Plan: CT of abdomen and pelvis for further evaluation. CBC and comprehensive metabolic panel are within normal limits. Sedimentation rate is 1.  Addendum: CT of abdomen and pelvis is negative except for bilateral renal stones.  25 minutes spent with patient. If symptoms persist, she will need see gastroenterologist

## 2013-11-26 NOTE — Patient Instructions (Signed)
To have CT of abdomen and pelvis. Call if symptoms persist or recur

## 2014-04-17 ENCOUNTER — Other Ambulatory Visit: Payer: BC Managed Care – PPO | Admitting: Internal Medicine

## 2014-04-17 DIAGNOSIS — Z79899 Other long term (current) drug therapy: Secondary | ICD-10-CM

## 2014-04-17 DIAGNOSIS — E785 Hyperlipidemia, unspecified: Secondary | ICD-10-CM

## 2014-04-17 LAB — LIPID PANEL
Cholesterol: 173 mg/dL (ref 0–200)
HDL: 60 mg/dL (ref >39)
LDL Cholesterol: 96 mg/dL (ref 0–99)
TRIGLYCERIDES: 86 mg/dL (ref <150)
Total CHOL/HDL Ratio: 2.9 Ratio
VLDL: 17 mg/dL (ref 0–40)

## 2014-04-17 LAB — HEPATIC FUNCTION PANEL
ALT: 37 U/L — ABNORMAL HIGH (ref 0–35)
AST: 24 U/L (ref 0–37)
Albumin: 4.3 g/dL (ref 3.5–5.2)
Alkaline Phosphatase: 63 U/L (ref 39–117)
BILIRUBIN DIRECT: 0.1 mg/dL (ref 0.0–0.3)
Indirect Bilirubin: 0.3 mg/dL (ref 0.2–1.2)
TOTAL PROTEIN: 6.7 g/dL (ref 6.0–8.3)
Total Bilirubin: 0.4 mg/dL (ref 0.2–1.2)

## 2014-04-20 ENCOUNTER — Encounter: Payer: Self-pay | Admitting: Internal Medicine

## 2014-04-20 ENCOUNTER — Ambulatory Visit (INDEPENDENT_AMBULATORY_CARE_PROVIDER_SITE_OTHER): Payer: BC Managed Care – PPO | Admitting: Internal Medicine

## 2014-04-20 VITALS — BP 116/82 | HR 80 | Temp 98.6°F | Wt 229.0 lb

## 2014-04-20 DIAGNOSIS — E669 Obesity, unspecified: Secondary | ICD-10-CM | POA: Insufficient documentation

## 2014-04-20 DIAGNOSIS — E785 Hyperlipidemia, unspecified: Secondary | ICD-10-CM

## 2014-04-20 DIAGNOSIS — Z87442 Personal history of urinary calculi: Secondary | ICD-10-CM

## 2014-04-20 NOTE — Progress Notes (Signed)
   Subjective:    Patient ID: [name], female    DOB: 1975-11-16, 38 y.o.   MRN: [id number]  HPI  Six-month recheck on hyperlipidemia. She is on Zocor 20 mg daily and lipid panel is entirely within normal limits. Last visit 6 months ago, SGPT was elevated at 48 and she had been L. recently. SGPT is now 37. She remains overweight. She's gained 5 pounds since last visit. Says she's working out and watching her diet. 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 Despite that she was unable to lose weight but was eating a lot of fast food and whenever she could grab. Says now she is beginning to eat more carefully and watch her food choices. Discussed this with her today. She also has a history of kidney stones but has not had a kidney stone recently.   Review of Systems     Objective:   Physical Exam  Not examined. Spent 15 minutes talking with her about obesity and hyperlipidemia. BMI is 36.      Assessment & Plan:   Hyperlipidemia  Obesity-BMI is 36  History of kidney stones  Plan: Return in 6 months for physical examination. Continue Zocor. Try the diet exercise and lose weight.

## 2014-05-22 ENCOUNTER — Other Ambulatory Visit: Payer: Self-pay | Admitting: Internal Medicine

## 2014-10-16 ENCOUNTER — Other Ambulatory Visit: Payer: BC Managed Care – PPO | Admitting: Internal Medicine

## 2014-10-16 DIAGNOSIS — Z1321 Encounter for screening for nutritional disorder: Secondary | ICD-10-CM

## 2014-10-16 DIAGNOSIS — Z1329 Encounter for screening for other suspected endocrine disorder: Secondary | ICD-10-CM

## 2014-10-16 DIAGNOSIS — Z0000 Encounter for general adult medical examination without abnormal findings: Secondary | ICD-10-CM

## 2014-10-16 DIAGNOSIS — Z13220 Encounter for screening for lipoid disorders: Secondary | ICD-10-CM

## 2014-10-16 DIAGNOSIS — Z130 Encounter for screening for diseases of the blood and blood-forming organs and certain disorders involving the immune mechanism: Secondary | ICD-10-CM

## 2014-10-16 LAB — COMPREHENSIVE METABOLIC PANEL
ALT: 22 U/L (ref 0–35)
AST: 18 U/L (ref 0–37)
Albumin: 4.4 g/dL (ref 3.5–5.2)
Alkaline Phosphatase: 67 U/L (ref 39–117)
BILIRUBIN TOTAL: 0.5 mg/dL (ref 0.2–1.2)
BUN: 12 mg/dL (ref 6–23)
CALCIUM: 9.6 mg/dL (ref 8.4–10.5)
CHLORIDE: 102 meq/L (ref 96–112)
CO2: 26 meq/L (ref 19–32)
Creat: 0.88 mg/dL (ref 0.50–1.10)
GLUCOSE: 84 mg/dL (ref 70–99)
Potassium: 4.6 mEq/L (ref 3.5–5.3)
Sodium: 140 mEq/L (ref 135–145)
Total Protein: 7 g/dL (ref 6.0–8.3)

## 2014-10-16 LAB — LIPID PANEL
CHOL/HDL RATIO: 2.8 ratio
CHOLESTEROL: 181 mg/dL (ref 0–200)
HDL: 65 mg/dL (ref >39)
LDL Cholesterol: 97 mg/dL (ref 0–99)
Triglycerides: 96 mg/dL (ref <150)
VLDL: 19 mg/dL (ref 0–40)

## 2014-10-16 LAB — CBC WITH DIFFERENTIAL/PLATELET
Basophils Absolute: 0.1 10*3/uL (ref 0.0–0.1)
Basophils Relative: 1 % (ref 0–1)
Eosinophils Absolute: 0.2 10*3/uL (ref 0.0–0.7)
Eosinophils Relative: 2 % (ref 0–5)
HEMATOCRIT: 43.3 % (ref 36.0–46.0)
HEMOGLOBIN: 14.8 g/dL (ref 12.0–15.0)
LYMPHS ABS: 2.2 10*3/uL (ref 0.7–4.0)
LYMPHS PCT: 27 % (ref 12–46)
MCH: 29.1 pg (ref 26.0–34.0)
MCHC: 34.2 g/dL (ref 30.0–36.0)
MCV: 85.1 fL (ref 78.0–100.0)
MPV: 9.3 fL — AB (ref 9.4–12.4)
Monocytes Absolute: 0.6 10*3/uL (ref 0.1–1.0)
Monocytes Relative: 7 % (ref 3–12)
Neutro Abs: 5.2 10*3/uL (ref 1.7–7.7)
Neutrophils Relative %: 63 % (ref 43–77)
Platelets: 346 10*3/uL (ref 150–400)
RBC: 5.09 MIL/uL (ref 3.87–5.11)
RDW: 13.9 % (ref 11.5–15.5)
WBC: 8.3 10*3/uL (ref 4.0–10.5)

## 2014-10-17 ENCOUNTER — Encounter: Payer: Self-pay | Admitting: Internal Medicine

## 2014-10-17 ENCOUNTER — Ambulatory Visit (INDEPENDENT_AMBULATORY_CARE_PROVIDER_SITE_OTHER): Payer: BC Managed Care – PPO | Admitting: Internal Medicine

## 2014-10-17 VITALS — BP 114/78 | HR 83 | Temp 98.2°F | Wt 231.0 lb

## 2014-10-17 DIAGNOSIS — Z23 Encounter for immunization: Secondary | ICD-10-CM

## 2014-10-17 DIAGNOSIS — M519 Unspecified thoracic, thoracolumbar and lumbosacral intervertebral disc disorder: Secondary | ICD-10-CM

## 2014-10-17 DIAGNOSIS — Z8619 Personal history of other infectious and parasitic diseases: Secondary | ICD-10-CM | POA: Diagnosis not present

## 2014-10-17 DIAGNOSIS — Z0000 Encounter for general adult medical examination without abnormal findings: Secondary | ICD-10-CM | POA: Diagnosis not present

## 2014-10-17 DIAGNOSIS — E785 Hyperlipidemia, unspecified: Secondary | ICD-10-CM

## 2014-10-17 DIAGNOSIS — E559 Vitamin D deficiency, unspecified: Secondary | ICD-10-CM | POA: Diagnosis not present

## 2014-10-17 DIAGNOSIS — J309 Allergic rhinitis, unspecified: Secondary | ICD-10-CM | POA: Diagnosis not present

## 2014-10-17 DIAGNOSIS — E669 Obesity, unspecified: Secondary | ICD-10-CM | POA: Diagnosis not present

## 2014-10-17 DIAGNOSIS — H6503 Acute serous otitis media, bilateral: Secondary | ICD-10-CM | POA: Diagnosis not present

## 2014-10-17 LAB — POCT URINALYSIS DIPSTICK
Bilirubin, UA: NEGATIVE
Glucose, UA: NEGATIVE
Ketones, UA: NEGATIVE
LEUKOCYTES UA: NEGATIVE
NITRITE UA: NEGATIVE
PH UA: 7
PROTEIN UA: POSITIVE
Spec Grav, UA: 1.015
Urobilinogen, UA: NEGATIVE

## 2014-10-17 LAB — VITAMIN D 25 HYDROXY (VIT D DEFICIENCY, FRACTURES): VIT D 25 HYDROXY: 29 ng/mL — AB (ref 30–100)

## 2014-10-17 LAB — TSH: TSH: 1.438 u[IU]/mL (ref 0.350–4.500)

## 2014-10-17 MED ORDER — AZITHROMYCIN 250 MG PO TABS: ORAL_TABLET | ORAL | Status: DC

## 2014-10-17 NOTE — Patient Instructions (Addendum)
Take Z-Pak as directed. Continue diet exercise and weight loss efforts. Return in 6 months for office visit lipid panel liver functions. At that time be screen for diabetes, HIV, hep C. Flu vaccine given today. Will need TDap next visit

## 2015-01-22 NOTE — Progress Notes (Signed)
   Subjective:    Patient ID: [name], female    DOB: 03-04-1976, 39 y.o.   MRN: [id number]  HPI  39 year old White Female in today for health maintenance exam. 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  Past medical history: Genital wart removal 2007 and 2008. Urinary tract infection 2005. Wisdom teeth extraction 1999. Is on statin medication for hyperlipidemia. Had colposcopy by GYN physician for history of HPV infection. History of low back pain with right L4-L5 disc on MRI in 2006. Had facet arthropathy at L5-S1. History of GE reflux treated with PPI in the past. Allergy testing done in 2009 showed positive allergy tested grass pollens, tree pollens, cat and some moles.  Sexually active. Not on birth control. Dr. Charlesetta Garibaldi is GYN physician.  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 She has a college degree. Single, never married. Does not smoke. 2-4 alcoholic drinks per week. One brother. Parents are living. Mother is a retired Equities trader. Father is a retired Engineer, structural.  Family history: Both parents are overweight. Mother with history of skin cancer diabetes and hypertension. Father with history of MI.    Review of Systems  Constitutional: Negative.   All other systems reviewed and are negative.      Objective:   Physical Exam  Constitutional: She is oriented to person, place, and time. She appears well-developed and well-nourished.  HENT:  Head: Normocephalic and atraumatic.  Right Ear: External ear normal.  Left Ear: External ear normal.  Mouth/Throat: Oropharynx is clear and moist. No oropharyngeal exudate.  Acute bilateral otitis with TMs red bilaterally  Eyes: Conjunctivae and EOM are normal. Pupils are equal, round, and reactive to light. Right eye exhibits no discharge. Left eye exhibits no discharge. No scleral icterus.  Neck: Neck  supple. No JVD present. No thyromegaly present.  Cardiovascular: Normal rate, regular rhythm, normal heart sounds and intact distal pulses.   No murmur heard. Pulmonary/Chest: Effort normal and breath sounds normal. No respiratory distress. She has no wheezes. She has no rales. She exhibits no tenderness.  Breasts normal female  Abdominal: Bowel sounds are normal. She exhibits no distension and no mass. There is no rebound and no guarding.  Genitourinary:  Deferred to GYN  Musculoskeletal: She exhibits no edema.  Neurological: She is alert and oriented to person, place, and time. No cranial nerve deficit.  Skin: Skin is warm and dry. No erythema.  Psychiatric: She has a normal mood and affect. Her behavior is normal. Thought content normal.  Vitals reviewed.         Assessment & Plan:  Hyperlipidemia-stable on statin therapy  Vitamin D deficiency-level is 29. Take 2000 units vitamin D 3 daily.  History of L4-L5 disc on MRI 2006-no recent exacerbations but has Flexeril  Bilateral otitis media/URI-treat with Zithromax Z-PAK  Obesity-continue diet exercise and weight loss efforts  History of human papilloma virus/genital warts  Allergic rhinitis  History of kidney stones  Plan: Return in 6 months for office visit, lipid panel liver functions, tetanus immunization update, screen for diabetes with hemoglobin A1c, HIV screening, Hep C screening

## 2015-04-17 ENCOUNTER — Other Ambulatory Visit: Payer: BC Managed Care – PPO | Admitting: Internal Medicine

## 2015-04-17 ENCOUNTER — Other Ambulatory Visit: Payer: Self-pay | Admitting: Internal Medicine

## 2015-04-17 DIAGNOSIS — Z79899 Other long term (current) drug therapy: Secondary | ICD-10-CM

## 2015-04-17 DIAGNOSIS — E785 Hyperlipidemia, unspecified: Secondary | ICD-10-CM

## 2015-04-17 LAB — LIPID PANEL
Cholesterol: 183 mg/dL (ref 0–200)
HDL: 63 mg/dL (ref >=46)
LDL CALC: 99 mg/dL (ref 0–99)
Total CHOL/HDL Ratio: 2.9 Ratio
Triglycerides: 104 mg/dL (ref <150)
VLDL: 21 mg/dL (ref 0–40)

## 2015-04-17 LAB — HEPATIC FUNCTION PANEL
ALBUMIN: 4 g/dL (ref 3.5–5.2)
ALK PHOS: 63 U/L (ref 39–117)
ALT: 37 U/L — ABNORMAL HIGH (ref 0–35)
AST: 24 U/L (ref 0–37)
BILIRUBIN INDIRECT: 0.4 mg/dL (ref 0.2–1.2)
Bilirubin, Direct: 0.1 mg/dL (ref 0.0–0.3)
TOTAL PROTEIN: 6.6 g/dL (ref 6.0–8.3)
Total Bilirubin: 0.5 mg/dL (ref 0.2–1.2)

## 2015-04-19 ENCOUNTER — Encounter: Payer: Self-pay | Admitting: Internal Medicine

## 2015-04-19 ENCOUNTER — Ambulatory Visit (INDEPENDENT_AMBULATORY_CARE_PROVIDER_SITE_OTHER): Payer: BC Managed Care – PPO | Admitting: Internal Medicine

## 2015-04-19 VITALS — BP 124/84 | HR 82 | Temp 98.1°F | Wt 225.5 lb

## 2015-04-19 DIAGNOSIS — Z113 Encounter for screening for infections with a predominantly sexual mode of transmission: Secondary | ICD-10-CM

## 2015-04-19 DIAGNOSIS — J069 Acute upper respiratory infection, unspecified: Secondary | ICD-10-CM | POA: Diagnosis not present

## 2015-04-19 DIAGNOSIS — Z8619 Personal history of other infectious and parasitic diseases: Secondary | ICD-10-CM | POA: Diagnosis not present

## 2015-04-19 DIAGNOSIS — E785 Hyperlipidemia, unspecified: Secondary | ICD-10-CM

## 2015-04-19 DIAGNOSIS — H6502 Acute serous otitis media, left ear: Secondary | ICD-10-CM

## 2015-04-19 MED ORDER — LEVOFLOXACIN 500 MG PO TABS: 500.0000 mg | ORAL_TABLET | Freq: Every day | ORAL | Status: DC

## 2015-04-19 NOTE — Patient Instructions (Signed)
Levaquin 500 milligrams daily for 7 days. Continue statin medication. Appointment with dermatologist regarding perineal lesions. STD testing completed

## 2015-04-19 NOTE — Progress Notes (Signed)
   Subjective:    Patient ID: [name], female    DOB: 1976-04-27, 39 y.o.   MRN: [id number]  HPI  She has a history of HPV infection. Has developed some lesions in the right perineal area that she's concerned about. Has a new sexual partner. Wants to be checked for STDs. She was really here today for six-month recheck on hyperlipidemia. She is on statin therapy. Liver panel is within normal limits with the exception of SGPT of 37 which basically is almost normal. Lipid panel is normal.   Also has acute URI symptoms for a couple of weeks. Thought it would go away. Has pain in left ear. Cough and congestion. No sore throat.     Review of Systems     Objective:   Physical Exam  Small papule right perineal area with some adjacent thickening looks a bit like HPV to me. GC and chlamydia probes taken.  Left TM is full but not red. Pharynx slightly injected. Neck supple without adenopathy. Chest clear to auscultation       Assessment & Plan:   Screen for STDs-add RPR and HIV to lab work already drawn. GC and chlamydia probes taken and pending  Refer to dermatologist to check for HPV-Dr. Amy Martinique  Hyperlipidemia-stable on statin therapy  Acute URI  Acute serous otitis media left  Plan: Levaquin 500 milligrams daily for 7 days for URI. CPE due in 6 months. Dermatology appointment to check for HPV

## 2015-04-20 LAB — HIV ANTIBODY (ROUTINE TESTING W REFLEX): HIV 1&2 Ab, 4th Generation: NONREACTIVE

## 2015-04-20 LAB — RPR

## 2015-04-20 LAB — GC/CHLAMYDIA PROBE AMP
CT Probe RNA: NEGATIVE
GC PROBE AMP APTIMA: NEGATIVE

## 2015-06-08 ENCOUNTER — Other Ambulatory Visit: Payer: Self-pay | Admitting: Internal Medicine

## 2015-10-25 ENCOUNTER — Other Ambulatory Visit: Payer: BC Managed Care – PPO | Admitting: Internal Medicine

## 2015-10-30 ENCOUNTER — Other Ambulatory Visit: Payer: BC Managed Care – PPO | Admitting: Internal Medicine

## 2015-10-30 DIAGNOSIS — Z0000 Encounter for general adult medical examination without abnormal findings: Secondary | ICD-10-CM

## 2015-10-30 LAB — COMPREHENSIVE METABOLIC PANEL
ALBUMIN: 4.1 g/dL (ref 3.6–5.1)
ALT: 20 U/L (ref 6–29)
AST: 17 U/L (ref 10–30)
Alkaline Phosphatase: 56 U/L (ref 33–115)
BUN: 12 mg/dL (ref 7–25)
CALCIUM: 9.2 mg/dL (ref 8.6–10.2)
CHLORIDE: 105 mmol/L (ref 98–110)
CO2: 28 mmol/L (ref 20–31)
CREATININE: 0.88 mg/dL (ref 0.50–1.10)
Glucose, Bld: 83 mg/dL (ref 65–99)
Potassium: 4.7 mmol/L (ref 3.5–5.3)
Sodium: 143 mmol/L (ref 135–146)
TOTAL PROTEIN: 6.3 g/dL (ref 6.1–8.1)
Total Bilirubin: 0.5 mg/dL (ref 0.2–1.2)

## 2015-10-30 LAB — LIPID PANEL
CHOLESTEROL: 158 mg/dL (ref 125–200)
HDL: 52 mg/dL (ref >=46)
LDL CALC: 82 mg/dL (ref <130)
TRIGLYCERIDES: 121 mg/dL (ref <150)
Total CHOL/HDL Ratio: 3 Ratio (ref <=5.0)
VLDL: 24 mg/dL (ref <30)

## 2015-10-30 LAB — CBC WITH DIFFERENTIAL/PLATELET
Basophils Absolute: 0.1 10*3/uL (ref 0.0–0.1)
Basophils Relative: 1 % (ref 0–1)
Eosinophils Absolute: 0.1 10*3/uL (ref 0.0–0.7)
Eosinophils Relative: 2 % (ref 0–5)
HEMATOCRIT: 43.4 % (ref 36.0–46.0)
HEMOGLOBIN: 14.1 g/dL (ref 12.0–15.0)
Lymphocytes Relative: 23 % (ref 12–46)
Lymphs Abs: 1.6 10*3/uL (ref 0.7–4.0)
MCH: 28.4 pg (ref 26.0–34.0)
MCHC: 32.5 g/dL (ref 30.0–36.0)
MCV: 87.3 fL (ref 78.0–100.0)
MONO ABS: 0.6 10*3/uL (ref 0.1–1.0)
MPV: 9.3 fL (ref 8.6–12.4)
Monocytes Relative: 8 % (ref 3–12)
NEUTROS ABS: 4.7 10*3/uL (ref 1.7–7.7)
Neutrophils Relative %: 66 % (ref 43–77)
Platelets: 364 10*3/uL (ref 150–400)
RBC: 4.97 MIL/uL (ref 3.87–5.11)
RDW: 13.7 % (ref 11.5–15.5)
WBC: 7.1 10*3/uL (ref 4.0–10.5)

## 2015-10-30 LAB — TSH: TSH: 1.914 u[IU]/mL (ref 0.350–4.500)

## 2015-10-31 LAB — VITAMIN D 25 HYDROXY (VIT D DEFICIENCY, FRACTURES): Vit D, 25-Hydroxy: 32 ng/mL (ref 30–100)

## 2015-11-01 ENCOUNTER — Encounter: Payer: Self-pay | Admitting: Internal Medicine

## 2015-11-01 ENCOUNTER — Ambulatory Visit (INDEPENDENT_AMBULATORY_CARE_PROVIDER_SITE_OTHER): Payer: BC Managed Care – PPO | Admitting: Internal Medicine

## 2015-11-01 VITALS — BP 124/84 | HR 98 | Temp 98.0°F | Resp 18 | Ht 68.0 in | Wt 226.0 lb

## 2015-11-01 DIAGNOSIS — B977 Papillomavirus as the cause of diseases classified elsewhere: Secondary | ICD-10-CM

## 2015-11-01 DIAGNOSIS — Z0000 Encounter for general adult medical examination without abnormal findings: Secondary | ICD-10-CM

## 2015-11-01 DIAGNOSIS — E785 Hyperlipidemia, unspecified: Secondary | ICD-10-CM | POA: Diagnosis not present

## 2015-11-01 DIAGNOSIS — J309 Allergic rhinitis, unspecified: Secondary | ICD-10-CM

## 2015-11-01 DIAGNOSIS — E669 Obesity, unspecified: Secondary | ICD-10-CM

## 2015-11-01 DIAGNOSIS — Z87442 Personal history of urinary calculi: Secondary | ICD-10-CM

## 2015-11-01 DIAGNOSIS — Z8739 Personal history of other diseases of the musculoskeletal system and connective tissue: Secondary | ICD-10-CM

## 2015-11-01 DIAGNOSIS — A630 Anogenital (venereal) warts: Secondary | ICD-10-CM

## 2015-11-26 NOTE — Progress Notes (Signed)
   Subjective:    Patient ID: [name], female    DOB: 1976-06-06, 40 y.o.   MRN: [id number]  HPI 40 year old Female in today for health maintenance exam and evaluation of medical problems including allergic rhinitis, low back pain, vitamin D deficiency, genital warts from human papilloma virus, hyperlipidemia, kidney stones.  Past medical history: Genital warts removed 2007 and 2008. Urinary tract infection 2005. Wisdom teeth extraction 1999. Had colposcopy by GYN physician for history of HPV infection. History of low back pain with right L4-L5 disc on MRI in 2006. Had facet arthropathy at L5-S1. Allergy testing done in 2009 showed positive allergy tested grass pollens, tree pollens, cat and some molds.  Is on statin medication for hyperlipidemia.  Sexually active. No birth control. Dr. Charlesetta Garibaldi is GYN physician.  History of GE reflux treated with PPI.  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 Does not smoke. 2-4 alcoholic drinks per week. One brother. Parents are living. Mother is retired Equities trader. Father is retired Engineer, structural.  Family history: Both parents are overweight. Mother with history of skin cancer, diabetes, hypertension. Father with history of MI.    Review of Systems  Constitutional: Negative.   All other systems reviewed and are negative.      Objective:   Physical Exam  Constitutional: She is oriented to person, place, and time. She appears well-developed and well-nourished. No distress.  HENT:  Head: Normocephalic and atraumatic.  Right Ear: External ear normal.  Left Ear: External ear normal.  Mouth/Throat: Oropharynx is clear and moist. No oropharyngeal exudate.  Eyes: Conjunctivae and EOM are normal. Pupils are equal, round, and reactive to light. Right eye exhibits no discharge. Left eye exhibits no discharge. No scleral icterus.  Neck: Neck supple. No JVD present. No  thyromegaly present.  Cardiovascular: Normal rate, regular rhythm and normal heart sounds.   No murmur heard. Pulmonary/Chest: Effort normal and breath sounds normal. No respiratory distress. She has no wheezes. She has no rales.  Breasts normal female without masses  Abdominal: Soft. Bowel sounds are normal. She exhibits no distension and no mass. There is no tenderness. There is no rebound and no guarding.  Genitourinary:  Deferred to GYN physician  Musculoskeletal: Normal range of motion. She exhibits no edema.  Lymphadenopathy:    She has no cervical adenopathy.  Neurological: She is alert and oriented to person, place, and time. She has normal reflexes. No cranial nerve deficit. Coordination normal.  Skin: Skin is warm and dry. No rash noted. She is not diaphoretic.  Psychiatric: She has a normal mood and affect. Her behavior is normal. Judgment and thought content normal.  Vitals reviewed.         Assessment & Plan:  Hyperlipidemia-stable on statin therapy  History of genital warts  History of low back pain  Allergic rhinitis  Obesity-encouraged diet and exercise  History of vitamin D deficiency  History of kidney stones  Plan: Continue same medication and return in one year or as needed.

## 2015-11-27 NOTE — Patient Instructions (Signed)
It was a pleasure to see you today. Continue same medications and return in one year or as needed. 

## 2015-11-29 ENCOUNTER — Other Ambulatory Visit: Payer: Self-pay | Admitting: Internal Medicine

## 2015-12-14 ENCOUNTER — Emergency Department (HOSPITAL_BASED_OUTPATIENT_CLINIC_OR_DEPARTMENT_OTHER)
Admission: EM | Admit: 2015-12-14 | Discharge: 2015-12-14 | Disposition: A | Discharge status: Home / Self Care | Payer: BC Managed Care – PPO | Attending: Emergency Medicine | Admitting: Emergency Medicine

## 2015-12-14 ENCOUNTER — Encounter (HOSPITAL_BASED_OUTPATIENT_CLINIC_OR_DEPARTMENT_OTHER): Payer: Self-pay

## 2015-12-14 ENCOUNTER — Emergency Department (HOSPITAL_BASED_OUTPATIENT_CLINIC_OR_DEPARTMENT_OTHER): Payer: BC Managed Care – PPO

## 2015-12-14 DIAGNOSIS — E785 Hyperlipidemia, unspecified: Secondary | ICD-10-CM | POA: Diagnosis not present

## 2015-12-14 DIAGNOSIS — Z8739 Personal history of other diseases of the musculoskeletal system and connective tissue: Secondary | ICD-10-CM | POA: Diagnosis not present

## 2015-12-14 DIAGNOSIS — Z79899 Other long term (current) drug therapy: Secondary | ICD-10-CM | POA: Diagnosis not present

## 2015-12-14 DIAGNOSIS — Z87891 Personal history of nicotine dependence: Secondary | ICD-10-CM | POA: Insufficient documentation

## 2015-12-14 DIAGNOSIS — Z8619 Personal history of other infectious and parasitic diseases: Secondary | ICD-10-CM | POA: Diagnosis not present

## 2015-12-14 DIAGNOSIS — R109 Unspecified abdominal pain: Secondary | ICD-10-CM | POA: Diagnosis present

## 2015-12-14 DIAGNOSIS — N200 Calculus of kidney: Secondary | ICD-10-CM | POA: Diagnosis not present

## 2015-12-14 DIAGNOSIS — Z3202 Encounter for pregnancy test, result negative: Secondary | ICD-10-CM | POA: Insufficient documentation

## 2015-12-14 DIAGNOSIS — R52 Pain, unspecified: Secondary | ICD-10-CM

## 2015-12-14 DIAGNOSIS — E559 Vitamin D deficiency, unspecified: Secondary | ICD-10-CM | POA: Insufficient documentation

## 2015-12-14 LAB — PREGNANCY, URINE: Preg Test, Ur: NEGATIVE

## 2015-12-14 LAB — URINALYSIS, ROUTINE W REFLEX MICROSCOPIC
BILIRUBIN URINE: NEGATIVE
Glucose, UA: NEGATIVE mg/dL
Ketones, ur: NEGATIVE mg/dL
LEUKOCYTES UA: NEGATIVE
Nitrite: NEGATIVE
PH: 6.5 (ref 5.0–8.0)
Protein, ur: NEGATIVE mg/dL
SPECIFIC GRAVITY, URINE: 1.026 (ref 1.005–1.030)

## 2015-12-14 LAB — CBC WITH DIFFERENTIAL/PLATELET
Basophils Absolute: 0.1 10*3/uL (ref 0.0–0.1)
Basophils Relative: 1 %
Eosinophils Absolute: 0.1 10*3/uL (ref 0.0–0.7)
Eosinophils Relative: 1 %
HEMATOCRIT: 40.7 % (ref 36.0–46.0)
Hemoglobin: 13.4 g/dL (ref 12.0–15.0)
LYMPHS ABS: 1.9 10*3/uL (ref 0.7–4.0)
Lymphocytes Relative: 24 %
MCH: 28.3 pg (ref 26.0–34.0)
MCHC: 32.9 g/dL (ref 30.0–36.0)
MCV: 85.9 fL (ref 78.0–100.0)
MONOS PCT: 7 %
Monocytes Absolute: 0.5 10*3/uL (ref 0.1–1.0)
NEUTROS ABS: 5.4 10*3/uL (ref 1.7–7.7)
NEUTROS PCT: 67 %
Platelets: 330 10*3/uL (ref 150–400)
RBC: 4.74 MIL/uL (ref 3.87–5.11)
RDW: 13.5 % (ref 11.5–15.5)
WBC: 8 10*3/uL (ref 4.0–10.5)

## 2015-12-14 LAB — BASIC METABOLIC PANEL
ANION GAP: 10 (ref 5–15)
BUN: 15 mg/dL (ref 6–20)
CO2: 26 mmol/L (ref 22–32)
Calcium: 8.9 mg/dL (ref 8.9–10.3)
Chloride: 103 mmol/L (ref 101–111)
Creatinine, Ser: 0.9 mg/dL (ref 0.44–1.00)
GFR calc Af Amer: >60 mL/min (ref >60)
GLUCOSE: 146 mg/dL — AB (ref 65–99)
POTASSIUM: 3.6 mmol/L (ref 3.5–5.1)
Sodium: 139 mmol/L (ref 135–145)

## 2015-12-14 LAB — URINE MICROSCOPIC-ADD ON

## 2015-12-14 MED ORDER — OXYCODONE-ACETAMINOPHEN 5-325 MG PO TABS
1.0000 | ORAL_TABLET | Freq: Four times a day (QID) | ORAL | Status: DC | PRN
Start: 2015-12-14 — End: 2016-03-11

## 2015-12-14 MED ORDER — TAMSULOSIN HCL 0.4 MG PO CAPS
0.4000 mg | ORAL_CAPSULE | Freq: Every day | ORAL | Status: DC
  Administered 2015-12-14: 0.4 mg via ORAL
  Filled 2015-12-14: qty 1

## 2015-12-14 MED ORDER — ONDANSETRON HCL 4 MG/2ML IJ SOLN
4.0000 mg | Freq: Once | INTRAMUSCULAR | Status: AC
  Administered 2015-12-14: 4 mg via INTRAVENOUS
  Filled 2015-12-14: qty 2

## 2015-12-14 MED ORDER — TAMSULOSIN HCL 0.4 MG PO CAPS: 0.4000 mg | ORAL_CAPSULE | Freq: Every day | ORAL | Status: DC

## 2015-12-14 MED ORDER — FENTANYL CITRATE (PF) 100 MCG/2ML IJ SOLN
100.0000 ug | Freq: Once | INTRAMUSCULAR | Status: AC
  Administered 2015-12-14: 100 ug via INTRAVENOUS
  Filled 2015-12-14: qty 2

## 2015-12-14 MED ORDER — KETOROLAC TROMETHAMINE 30 MG/ML IJ SOLN
30.0000 mg | Freq: Once | INTRAMUSCULAR | Status: AC
  Administered 2015-12-14: 30 mg via INTRAVENOUS
  Filled 2015-12-14: qty 1

## 2015-12-14 MED ORDER — NAPROXEN 375 MG PO TABS: 375.0000 mg | ORAL_TABLET | Freq: Two times a day (BID) | ORAL | Status: DC

## 2015-12-14 MED ORDER — OXYCODONE-ACETAMINOPHEN 5-325 MG PO TABS: 2.0000 | ORAL_TABLET | Freq: Once | ORAL | Status: DC

## 2015-12-14 MED ORDER — ONDANSETRON 8 MG PO TBDP: ORAL_TABLET | ORAL | Status: DC

## 2015-12-14 NOTE — ED Provider Notes (Signed)
CSN: [id number]     Arrival date & time 12/14/15  0234 History   First MD Initiated Contact with Patient 12/14/15 0256     Chief Complaint  Patient presents with  . Flank Pain     (Consider location/radiation/quality/duration/timing/severity/associated sxs/prior Treatment) Patient is a 40 y.o. female presenting with flank pain. The history is provided by the patient.  Flank Pain This is a recurrent problem. The current episode started 3 to 5 hours ago. The problem occurs constantly. The problem has not changed since onset.Pertinent negatives include no chest pain, no abdominal pain, no headaches and no shortness of breath. Nothing aggravates the symptoms. Nothing relieves the symptoms. She has tried nothing for the symptoms. The treatment provided no relief.    Past Medical History  Diagnosis Date  . Allergy   . Hyperlipidemia   . Genital warts   . Low back pain   . Vitamin D deficiency    Past Surgical History  Procedure Laterality Date  . Extraction of wisdom teeth     Family History  Problem Relation Age of Onset  . Diabetes Mother     At Risk   . Cancer Mother     Skin   . Heart disease Father     Heart Attack   . Diabetes Maternal Grandmother   . Hypertension Maternal Grandmother    Social History  Substance Use Topics  . Smoking status: Former Smoker    Types: Cigarettes    Quit date: 07/23/2004  . Smokeless tobacco: Never Used  . Alcohol Use: Yes     Comment: socially   OB History    No data available     Review of Systems  Respiratory: Negative for shortness of breath.   Cardiovascular: Negative for chest pain.  Gastrointestinal: Positive for nausea and vomiting. Negative for abdominal pain.  Genitourinary: Positive for flank pain.  Neurological: Negative for headaches.  All other systems reviewed and are negative.     Allergies  Review of patient's allergies indicates no known allergies.  Home Medications   Prior to Admission medications    Medication Sig Start Date End Date Taking? Authorizing Provider  cyclobenzaprine (FLEXERIL) 10 MG tablet Take 1 tablet (10 mg total) by mouth 3 (three) times daily as needed. 10/04/12   Elby Showers, MD  folic acid (FOLVITE) 1 MG tablet  09/16/13   Historical Provider, MD  simvastatin (ZOCOR) 20 MG tablet TAKE 1 TABLET BY MOUTH DAILY 11/29/15   Elby Showers, MD   BP 134/91 mmHg  Pulse 75  Temp(Src) 97.9 F (36.6 C) (Oral)  Resp 18  Ht 5\' 8"  (1.727 m)  Wt 230 lb (104.327 kg)  BMI 34.98 kg/m2  SpO2 100%  LMP 12/02/2015 Physical Exam  Constitutional: She is oriented to person, place, and time. She appears well-developed and well-nourished. No distress.  HENT:  Head: Normocephalic and atraumatic.  Mouth/Throat: Oropharynx is clear and moist.  Eyes: Conjunctivae are normal. Pupils are equal, round, and reactive to light.  Neck: Normal range of motion. Neck supple.  Cardiovascular: Normal rate, regular rhythm and intact distal pulses.   Pulmonary/Chest: Effort normal and breath sounds normal. No respiratory distress. She has no wheezes. She has no rales.  Abdominal: Soft. Bowel sounds are normal. There is no tenderness. There is no rebound and no guarding.  Musculoskeletal: Normal range of motion.  Neurological: She is alert and oriented to person, place, and time.  Skin: Skin is warm and dry.  Psychiatric: She  has a normal mood and affect.    ED Course  Procedures (including critical care time) Labs Review Labs Reviewed  URINALYSIS, ROUTINE W REFLEX MICROSCOPIC (NOT AT [facility]) - Abnormal; Notable for the following:    Hgb urine dipstick LARGE (*)    All other components within normal limits  BASIC METABOLIC PANEL - Abnormal; Notable for the following:    Glucose, Bld 146 (*)    All other components within normal limits  URINE MICROSCOPIC-ADD ON - Abnormal; Notable for the following:    Squamous Epithelial / LPF 0-5 (*)    Bacteria, UA RARE (*)    All other components within  normal limits  PREGNANCY, URINE  CBC WITH DIFFERENTIAL/PLATELET    Imaging Review No results found. I have personally reviewed and evaluated these images and lab results as part of my medical decision-making.   EKG Interpretation None      MDM   Final diagnoses:  Pain    Kidney stone will treat with percocet, flomax, zofran and close follow up with a urologist.  Strain all urine.  Strict return precautions given    Yuridiana Formanek, MD 12/14/15 [id number]

## 2015-12-14 NOTE — ED Notes (Signed)
Pt c/o rt flank pain x3hrs, hx of kidney stones, feels the same except lasting longer, has past 3 before; vomiting x2 d/t pain; urinary difficulty with frequency

## 2016-02-28 ENCOUNTER — Telehealth: Payer: Self-pay | Admitting: Internal Medicine

## 2016-02-28 NOTE — Telephone Encounter (Signed)
Yesterday, she moved wrong and is having some back spasms that started.  Continuing to have them today.  You had given her some Flexeril back in 2014.  She still has it from 2014, but is afraid to to use it since it's outdate.  Is it ok to use?  Or, do you want to call in a new Rx for her?  States she obviously didn't use them very much since she still has the Rx left from 3 years ago.  She just wasn't sure about using the medication since it has expired.  And, how often can she take them?  Please advise.    Pharmacy:  [facility] @ [place]

## 2016-02-28 NOTE — Telephone Encounter (Signed)
She can still take the Flexeril she has. May take 10 mg 3 times daily but causes drowsiness Does she need more?

## 2016-02-28 NOTE — Telephone Encounter (Signed)
Patient notified

## 2016-03-11 ENCOUNTER — Ambulatory Visit (INDEPENDENT_AMBULATORY_CARE_PROVIDER_SITE_OTHER): Payer: BC Managed Care – PPO | Admitting: Internal Medicine

## 2016-03-11 ENCOUNTER — Encounter: Payer: Self-pay | Admitting: Internal Medicine

## 2016-03-11 VITALS — BP 124/76 | HR 110 | Temp 98.8°F | Resp 18 | Ht 68.0 in | Wt 220.5 lb

## 2016-03-11 DIAGNOSIS — J069 Acute upper respiratory infection, unspecified: Secondary | ICD-10-CM | POA: Diagnosis not present

## 2016-03-11 MED ORDER — METHYLPREDNISOLONE ACETATE 80 MG/ML IJ SUSP
80.0000 mg | Freq: Once | INTRAMUSCULAR | Status: AC
  Administered 2016-03-11: 80 mg via INTRAMUSCULAR

## 2016-03-11 MED ORDER — DOXYCYCLINE HYCLATE 100 MG PO TABS
100.0000 mg | ORAL_TABLET | Freq: Two times a day (BID) | ORAL | Status: DC
Start: 2016-03-11 — End: 2016-09-16

## 2016-03-17 NOTE — Patient Instructions (Signed)
Depo-Medrol 80 mg IM. Doxycycline 100 mg twice daily for 10 days. Call if symptoms persist.

## 2016-03-17 NOTE — Progress Notes (Signed)
   Subjective:    Patient ID: [name], female    DOB: 25-May-1976, 40 y.o.   MRN: [id number]  HPI For perhaps 2 months, patient has noticed coughing with a foul taste in her mouth. Patient thinks this could be perhaps sinus drainage but she's not sure. She's had no fever or chills. Seems to produce sputum spontaneously. This could be postnasal drip.    Review of Systems as above     Objective:   Physical Exam Pharynx is clear. TMs are clear. Neck is supple without thyromegaly or adenopathy. Chest is clear to auscultation without rales or wheezing       Assessment & Plan:  Possible maxillary sinusitis  Allergic rhinitis  Plan: Doxycycline 100 mg twice daily for 10 days. Depo-Medrol 80 mg IM

## 2016-05-07 ENCOUNTER — Telehealth: Payer: Self-pay | Admitting: Internal Medicine

## 2016-05-07 NOTE — Telephone Encounter (Signed)
Called and spoke with patient; advised that she should keep a log of her stools.  Patient states she has only had the pale stools for 2-3 days and they go between normal stools and the pale stools.  Advised that Dr. Renold Genta wants to see her only if this persists for a month or longer.  We are cancelling her appointment for tomorrow, 7/13 and advised patient to keep a log of stools.  She has no fever, no diarrhea, no pain.  States she has not been eating abnormal.  She'll call back if she needs to make an appointment in 3-4 weeks.

## 2016-05-08 ENCOUNTER — Ambulatory Visit: Payer: Self-pay | Admitting: Internal Medicine

## 2016-09-16 ENCOUNTER — Encounter: Payer: Self-pay | Admitting: Internal Medicine

## 2016-09-16 ENCOUNTER — Ambulatory Visit (INDEPENDENT_AMBULATORY_CARE_PROVIDER_SITE_OTHER): Payer: BC Managed Care – PPO | Admitting: Internal Medicine

## 2016-09-16 VITALS — BP 116/80 | HR 90 | Temp 99.4°F | Ht 68.0 in | Wt 230.0 lb

## 2016-09-16 DIAGNOSIS — H6692 Otitis media, unspecified, left ear: Secondary | ICD-10-CM | POA: Diagnosis not present

## 2016-09-16 DIAGNOSIS — M542 Cervicalgia: Secondary | ICD-10-CM | POA: Diagnosis not present

## 2016-09-16 MED ORDER — METHYLPREDNISOLONE ACETATE 80 MG/ML IJ SUSP
80.0000 mg | Freq: Once | INTRAMUSCULAR | Status: AC
  Administered 2016-09-16: 80 mg via INTRAMUSCULAR

## 2016-09-16 MED ORDER — DOXYCYCLINE HYCLATE 100 MG PO TABS: 100.0000 mg | ORAL_TABLET | Freq: Two times a day (BID) | ORAL | 0 refills | Status: DC

## 2016-09-16 MED ORDER — CYCLOBENZAPRINE HCL 10 MG PO TABS: 10.0000 mg | ORAL_TABLET | Freq: Three times a day (TID) | ORAL | 11 refills | Status: DC | PRN

## 2016-09-16 NOTE — Progress Notes (Signed)
   Subjective:    Patient ID: [name], female    DOB: November 22, 1975, 40 y.o.   MRN: [id number]  HPI About a month ago, she had some left-sided neck pain. Subsequently developed some stuffiness in left ear and it doesn't feel right even now. No recent respiratory infection that she is aware of. No sore throat congestion or cough. Neck pain has improved somewhat. She has some old Flexeril at home but is afraid to take it. New prescription will be given today.    Review of Systems as above     Objective:   Physical Exam She has palpable spasm left sternocleidomastoid muscle area. No lymphadenopathy in neck. Pharynx is clear. Left TM is red and full. Right TM is full but not red. Chest clear to auscultation.       Assessment & Plan:  Left neck spasm  Left otitis media  Plan: Doxycycline 100 mg twice daily for 10 days. Read filled Flexeril as previously prescribed for neck pain. May use ice or heat on neck is needed. Depo-Medrol 80 mg IM.

## 2016-09-16 NOTE — Patient Instructions (Signed)
Doxycycline 100 mg twice daily for 10 days. Depo-Medrol 80 mg IM. Flexeril refilled. Ice or heat to neck.

## 2016-10-30 ENCOUNTER — Other Ambulatory Visit (INDEPENDENT_AMBULATORY_CARE_PROVIDER_SITE_OTHER): Payer: BC Managed Care – PPO | Admitting: Internal Medicine

## 2016-10-30 DIAGNOSIS — E7800 Pure hypercholesterolemia, unspecified: Secondary | ICD-10-CM

## 2016-10-30 DIAGNOSIS — Z0000 Encounter for general adult medical examination without abnormal findings: Secondary | ICD-10-CM

## 2016-10-30 LAB — CBC WITH DIFFERENTIAL/PLATELET
BASOS ABS: 57 {cells}/uL (ref 0–200)
Basophils Relative: 1 %
EOS ABS: 171 {cells}/uL (ref 15–500)
Eosinophils Relative: 3 %
HEMATOCRIT: 43.4 % (ref 35.0–45.0)
HEMOGLOBIN: 14.2 g/dL (ref 11.7–15.5)
LYMPHS ABS: 1824 {cells}/uL (ref 850–3900)
Lymphocytes Relative: 32 %
MCH: 29.1 pg (ref 27.0–33.0)
MCHC: 32.7 g/dL (ref 32.0–36.0)
MCV: 88.9 fL (ref 80.0–100.0)
MONO ABS: 456 {cells}/uL (ref 200–950)
MPV: 9.1 fL (ref 7.5–12.5)
Monocytes Relative: 8 %
NEUTROS PCT: 56 %
Neutro Abs: 3192 cells/uL (ref 1500–7800)
Platelets: 358 10*3/uL (ref 140–400)
RBC: 4.88 MIL/uL (ref 3.80–5.10)
RDW: 13.8 % (ref 11.0–15.0)
WBC: 5.7 10*3/uL (ref 3.8–10.8)

## 2016-10-30 LAB — COMPREHENSIVE METABOLIC PANEL
ALBUMIN: 4.1 g/dL (ref 3.6–5.1)
ALK PHOS: 57 U/L (ref 33–115)
ALT: 22 U/L (ref 6–29)
AST: 19 U/L (ref 10–30)
BILIRUBIN TOTAL: 0.5 mg/dL (ref 0.2–1.2)
BUN: 13 mg/dL (ref 7–25)
CALCIUM: 9.3 mg/dL (ref 8.6–10.2)
CO2: 23 mmol/L (ref 20–31)
Chloride: 104 mmol/L (ref 98–110)
Creat: 0.84 mg/dL (ref 0.50–1.10)
Glucose, Bld: 83 mg/dL (ref 65–99)
Potassium: 4.5 mmol/L (ref 3.5–5.3)
Sodium: 139 mmol/L (ref 135–146)
Total Protein: 6.6 g/dL (ref 6.1–8.1)

## 2016-10-30 LAB — TSH: TSH: 1.26 mIU/L

## 2016-10-30 LAB — LIPID PANEL
CHOLESTEROL: 190 mg/dL (ref <200)
HDL: 64 mg/dL (ref >50)
LDL Cholesterol: 106 mg/dL — ABNORMAL HIGH (ref <100)
TRIGLYCERIDES: 101 mg/dL (ref <150)
Total CHOL/HDL Ratio: 3 Ratio (ref <5.0)
VLDL: 20 mg/dL (ref <30)

## 2016-11-03 ENCOUNTER — Encounter: Payer: BC Managed Care – PPO | Admitting: Internal Medicine

## 2016-11-05 ENCOUNTER — Encounter: Payer: Self-pay | Admitting: Internal Medicine

## 2016-12-23 ENCOUNTER — Encounter: Payer: Self-pay | Admitting: Internal Medicine

## 2016-12-23 ENCOUNTER — Ambulatory Visit (INDEPENDENT_AMBULATORY_CARE_PROVIDER_SITE_OTHER): Payer: BC Managed Care – PPO | Admitting: Internal Medicine

## 2016-12-23 VITALS — BP 120/74 | HR 81 | Temp 98.6°F | Ht 66.0 in | Wt 226.0 lb

## 2016-12-23 DIAGNOSIS — Z0000 Encounter for general adult medical examination without abnormal findings: Secondary | ICD-10-CM | POA: Diagnosis not present

## 2016-12-23 DIAGNOSIS — A630 Anogenital (venereal) warts: Secondary | ICD-10-CM | POA: Diagnosis not present

## 2016-12-23 DIAGNOSIS — N6012 Diffuse cystic mastopathy of left breast: Secondary | ICD-10-CM

## 2016-12-23 DIAGNOSIS — N6011 Diffuse cystic mastopathy of right breast: Secondary | ICD-10-CM

## 2016-12-23 DIAGNOSIS — Z8739 Personal history of other diseases of the musculoskeletal system and connective tissue: Secondary | ICD-10-CM

## 2016-12-23 DIAGNOSIS — Z87442 Personal history of urinary calculi: Secondary | ICD-10-CM

## 2016-12-23 DIAGNOSIS — E784 Other hyperlipidemia: Secondary | ICD-10-CM | POA: Diagnosis not present

## 2016-12-23 DIAGNOSIS — E7849 Other hyperlipidemia: Secondary | ICD-10-CM

## 2016-12-23 DIAGNOSIS — J302 Other seasonal allergic rhinitis: Secondary | ICD-10-CM

## 2016-12-23 LAB — POCT URINALYSIS DIPSTICK
Bilirubin, UA: NEGATIVE
GLUCOSE UA: NEGATIVE
Ketones, UA: NEGATIVE
LEUKOCYTES UA: NEGATIVE
Nitrite, UA: NEGATIVE
Protein, UA: NEGATIVE
Spec Grav, UA: 1.01
UROBILINOGEN UA: NEGATIVE
pH, UA: 7.5

## 2016-12-23 MED ORDER — SIMVASTATIN 20 MG PO TABS: 20.0000 mg | ORAL_TABLET | Freq: Every day | ORAL | 3 refills | Status: DC

## 2016-12-23 MED ORDER — FOLIC ACID 1 MG PO TABS: 1.0000 mg | ORAL_TABLET | Freq: Every day | ORAL | 3 refills | Status: DC

## 2016-12-23 NOTE — Progress Notes (Signed)
   Subjective:    Patient ID: [name], female    DOB: 12-09-1975, 41 y.o.   MRN: [id number]  HPI 41 year old FemaleFor health maintenance exam and evaluation of medical issues. History of hyperlipidemia treated with statin medication.  History of abnormal Pap smear. Has appointment to see GYN in the near future. 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 Genital warts removed in 2007 and 2008. She had urinary tract infection in 2005. Wisdom teeth extracted 1999. Had colposcopy by GYN physician for HPV infection.  History of low back pain with right L4-L5 disc on MRI in 2006. Had facet arthropathy L5-S1.  Allergy testing done 2009 showed positive allergy test to grass pollens, tree pollens, cat and some moles.  Sexually active. Uses protection.  History of GE reflux treated with PPI.  History of vitamin D deficiency.  History of kidney stones.    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 Has a college degree. Single never married. Does not smoke. 2-4 alcoholic drinks per week. One brother. Parents are living. Mother is a retired Equities trader. Father is a retired Engineer, structural.  Family history: Both parents are overweight. Mother with history of skin cancer, diabetes, hypertension. Father with history of MI.     Objective:   Physical Exam  Constitutional: She is oriented to person, place, and time. She appears well-developed and well-nourished.  HENT:  Head: Normocephalic and atraumatic.  Right Ear: External ear normal.  Left Ear: External ear normal.  Mouth/Throat: Oropharynx is clear and moist.  Eyes: Conjunctivae and EOM are normal. Pupils are equal, round, and reactive to light. Right eye exhibits no discharge. Left eye exhibits no discharge. No scleral icterus.  Neck: Neck supple. No JVD present. No thyromegaly present.  Cardiovascular: Normal rate, regular rhythm, normal heart sounds and  intact distal pulses.   No murmur heard. Pulmonary/Chest: Effort normal and breath sounds normal. No respiratory distress. She has no wheezes. She has no rales.  Breasts: bilateral thickening and nodularity.  Abdominal: Soft. Bowel sounds are normal. She exhibits no distension and no mass. There is no tenderness. There is no rebound and no guarding.  Genitourinary:  Genitourinary Comments: Deferred to GYN  Musculoskeletal: She exhibits no edema.  Lymphadenopathy:    She has no cervical adenopathy.  Neurological: She is alert and oriented to person, place, and time. She has normal reflexes. No cranial nerve deficit.  Skin: Skin is warm and dry. No rash noted.  Psychiatric: She has a normal mood and affect. Her behavior is normal. Judgment and thought content normal.  Vitals reviewed.         Assessment & Plan:  History of HPV infection and abnormal Pap smear-to see GYN physician in the near future  Fibrocystic breast disease-to have first mammogram in the near future  History of low back pain  Hyperlipidemia-stable on statin  Obesity discussed diet and exercise  Allergic rhinitis  History of vitamin D deficiency  History of kidney stones  Plan: Return in 6 months or as needed. Mammogram in the near future.

## 2016-12-24 ENCOUNTER — Other Ambulatory Visit: Payer: Self-pay | Admitting: Internal Medicine

## 2016-12-24 DIAGNOSIS — N6012 Diffuse cystic mastopathy of left breast: Principal | ICD-10-CM

## 2016-12-24 DIAGNOSIS — N6011 Diffuse cystic mastopathy of right breast: Secondary | ICD-10-CM

## 2016-12-24 NOTE — Patient Instructions (Signed)
It was pleasure to see today. See GYN physician regarding HPV concerns. Have mammogram. Continue same medications and return in 6 months.

## 2016-12-31 ENCOUNTER — Ambulatory Visit
Admission: RE | Admit: 2016-12-31 | Discharge: 2016-12-31 | Disposition: A | Discharge status: Home / Self Care | Payer: BC Managed Care – PPO | Source: Ambulatory Visit | Attending: Internal Medicine | Admitting: Internal Medicine

## 2016-12-31 DIAGNOSIS — N6011 Diffuse cystic mastopathy of right breast: Secondary | ICD-10-CM

## 2016-12-31 DIAGNOSIS — N6012 Diffuse cystic mastopathy of left breast: Principal | ICD-10-CM

## 2017-01-28 ENCOUNTER — Ambulatory Visit: Payer: BC Managed Care – PPO | Admitting: Internal Medicine

## 2017-04-15 ENCOUNTER — Telehealth: Payer: Self-pay

## 2017-04-15 ENCOUNTER — Emergency Department (HOSPITAL_COMMUNITY)
Admission: EM | Admit: 2017-04-15 | Discharge: 2017-04-15 | Disposition: A | Discharge status: Home / Self Care | Payer: BC Managed Care – PPO | Attending: Emergency Medicine | Admitting: Emergency Medicine

## 2017-04-15 ENCOUNTER — Emergency Department (HOSPITAL_COMMUNITY): Payer: BC Managed Care – PPO

## 2017-04-15 DIAGNOSIS — Z87891 Personal history of nicotine dependence: Secondary | ICD-10-CM | POA: Diagnosis not present

## 2017-04-15 DIAGNOSIS — Z79899 Other long term (current) drug therapy: Secondary | ICD-10-CM | POA: Diagnosis not present

## 2017-04-15 DIAGNOSIS — R079 Chest pain, unspecified: Secondary | ICD-10-CM | POA: Diagnosis present

## 2017-04-15 DIAGNOSIS — R0789 Other chest pain: Secondary | ICD-10-CM | POA: Diagnosis not present

## 2017-04-15 LAB — BASIC METABOLIC PANEL
Anion gap: 8 (ref 5–15)
BUN: 10 mg/dL (ref 6–20)
CO2: 22 mmol/L (ref 22–32)
Calcium: 8.7 mg/dL — ABNORMAL LOW (ref 8.9–10.3)
Chloride: 105 mmol/L (ref 101–111)
Creatinine, Ser: 0.78 mg/dL (ref 0.44–1.00)
GFR calc Af Amer: >60 mL/min (ref >60)
GLUCOSE: 101 mg/dL — AB (ref 65–99)
Potassium: 3.8 mmol/L (ref 3.5–5.1)
SODIUM: 135 mmol/L (ref 135–145)

## 2017-04-15 LAB — CBC WITH DIFFERENTIAL/PLATELET
BASOS ABS: 0 10*3/uL (ref 0.0–0.1)
Basophils Relative: 0 %
EOS PCT: 0 %
Eosinophils Absolute: 0 10*3/uL (ref 0.0–0.7)
HCT: 42.4 % (ref 36.0–46.0)
Hemoglobin: 14.5 g/dL (ref 12.0–15.0)
LYMPHS PCT: 8 %
Lymphs Abs: 0.6 10*3/uL — ABNORMAL LOW (ref 0.7–4.0)
MCH: 29.2 pg (ref 26.0–34.0)
MCHC: 34.2 g/dL (ref 30.0–36.0)
MCV: 85.3 fL (ref 78.0–100.0)
MONO ABS: 0.2 10*3/uL (ref 0.1–1.0)
Monocytes Relative: 3 %
Neutro Abs: 6.5 10*3/uL (ref 1.7–7.7)
Neutrophils Relative %: 89 %
Platelets: 303 10*3/uL (ref 150–400)
RBC: 4.97 MIL/uL (ref 3.87–5.11)
RDW: 13.9 % (ref 11.5–15.5)
WBC: 7.3 10*3/uL (ref 4.0–10.5)

## 2017-04-15 LAB — I-STAT TROPONIN, ED: TROPONIN I, POC: 0 ng/mL (ref 0.00–0.08)

## 2017-04-15 MED ORDER — ACETAMINOPHEN 500 MG PO TABS
1000.0000 mg | ORAL_TABLET | Freq: Once | ORAL | Status: AC
  Administered 2017-04-15: 1000 mg via ORAL
  Filled 2017-04-15: qty 2

## 2017-04-15 MED ORDER — IOPAMIDOL (ISOVUE-370) INJECTION 76%
INTRAVENOUS | Status: AC
  Administered 2017-04-15: 100 mL
  Filled 2017-04-15: qty 100

## 2017-04-15 MED ORDER — KETOROLAC TROMETHAMINE 30 MG/ML IJ SOLN
15.0000 mg | Freq: Once | INTRAMUSCULAR | Status: AC
  Administered 2017-04-15: 15 mg via INTRAVENOUS
  Filled 2017-04-15: qty 1

## 2017-04-15 MED ORDER — GI COCKTAIL ~~LOC~~
30.0000 mL | Freq: Once | ORAL | Status: AC
  Administered 2017-04-15: 30 mL via ORAL
  Filled 2017-04-15: qty 30

## 2017-04-15 MED ORDER — SODIUM CHLORIDE 0.9 % IV BOLUS (SEPSIS)
1000.0000 mL | Freq: Once | INTRAVENOUS | Status: AC
  Administered 2017-04-15: 1000 mL via INTRAVENOUS

## 2017-04-15 NOTE — ED Notes (Signed)
Pt verbalized understanding of discharge instructions.

## 2017-04-15 NOTE — Discharge Instructions (Signed)
All of your workup and imaging has been reassuring. No signs of blood clot. Motrin and Tylenol for pain and fever. Drink plenty of fluids. Make sure to follow up with her primary care doctor concerning your visit today and your CT findings as discussed. Return immediately to the ER if you develop worsening symptoms.

## 2017-04-15 NOTE — ED Triage Notes (Signed)
C/o nausea last pm states appprox.l 3am c/o left anterior chest pain with radiation to back and c/o sob fever chills Denies cough , loose stool this.

## 2017-04-15 NOTE — ED Notes (Signed)
Patient transported to CT 

## 2017-04-15 NOTE — Telephone Encounter (Signed)
Pt called and stated that she went to an UC this morning for shortness of breath. She said they told her that her d-dimer was elevated and that she possibly had a pulmonary embolism. Wanted advise on what to do and since Dr. Renold Genta is out of the office until Monday of next week I advised her to go to the ER. Pt agreed and stated she would head there now.

## 2017-04-17 NOTE — ED Provider Notes (Signed)
[facility] DEPT Provider Note   CSN: [id number] Arrival date & time: 04/15/17  1301     History   Chief Complaint Chief Complaint  Patient presents with  . Chest Pain    HPI [name] is a 41 y.o. female.  HPI 41 year old Caucasian female past medical history significant for hyperlipidemia, vitamin D deficiency that presents to the emergency department today with complaints of chest pain. Patient states that last night at approximately 3 AM she developed sudden onset left-sided chest pain that radiated to her back. Describes it as a pressure. The pain did not radiate to her arm or jaw. She states it is associated with nausea, shortness of breath, fever, chills. States that she took her temperature at home was 101.2. Took Tylenol and Motrin for her fever which resolved. Patient endorses mild shortness of breath with the chest pain. States that it was hard to take a deep breath. Patient denies any recent illness. She denies any cough. Patient does report episode of low stool this morning that resolved on its own. States that she went to urgent care prior to the ED visit where they perform labwork and told that she had elevated d-dimer a U to come to the ED for a CAT scan of her chest to rule out blood clots. Patient denies any history of DVT/PE, prolonged immobilization, recent hospitalizations/surgeries, exogenous hormone use, tobacco use, lower extremity edema or calf tenderness. Patient denies any history of cardiac disease or early family history of cardiac disease. Moving makes the pain worse. Nothing makes the pain better. Pt denies any ha, vision changes, lightheadedness, dizziness, congestion, neck pain, cough, abd pain, n/v/d, urinary symptoms, change in bowel habits, melena, hematochezia, lower extremity paresthesias.   Past Medical History:  Diagnosis Date  . Allergy   . Genital warts   . Hyperlipidemia   . Low back pain   . Vitamin D deficiency     Patient Active  Problem List   Diagnosis Date Noted  . Obesity, unspecified 04/20/2014  . Lumbar disc disease 12/21/2011  . History of migraine headaches 12/21/2011  . Hyperlipidemia 07/24/2011  . Low back pain 07/24/2011  . Vitamin D deficiency 07/24/2011  . History of HPV infection 07/24/2011  . Allergic rhinitis 07/24/2011  . History of kidney stones 07/24/2011  . LEG PAIN, RIGHT 11/13/2008    Past Surgical History:  Procedure Laterality Date  . extraction of wisdom teeth      OB History    No data available       Home Medications    Prior to Admission medications   Medication Sig Start Date End Date Taking? Authorizing Provider  folic acid (FOLVITE) 1 MG tablet Take 1 tablet (1 mg total) by mouth daily. 12/23/16  Yes Baxley, Cresenciano Lick, MD  ibuprofen (ADVIL,MOTRIN) 200 MG tablet Take 400 mg by mouth every 6 (six) hours as needed for mild pain.   Yes [provider]  loratadine (CLARITIN) 10 MG tablet Take 10 mg by mouth daily.   Yes [provider]  simvastatin (ZOCOR) 20 MG tablet Take 1 tablet (20 mg total) by mouth daily. 12/23/16  Yes Baxley, Cresenciano Lick, MD    Family History Family History  Problem Relation Age of Onset  . Diabetes Mother        At Risk   . Cancer Mother        Skin   . Heart disease Father        Heart Attack   .  Diabetes Maternal Grandmother   . Hypertension Maternal Grandmother     Social History Social History  Substance Use Topics  . Smoking status: Former Smoker    Types: Cigarettes    Quit date: 07/23/2004  . Smokeless tobacco: Never Used  . Alcohol use Yes     Comment: socially     Allergies   Patient has no known allergies.   Review of Systems Review of Systems  Constitutional: Positive for chills and fever.  HENT: Negative for congestion.   Eyes: Negative for visual disturbance.  Respiratory: Positive for cough and shortness of breath.   Cardiovascular: Positive for chest pain. Negative for palpitations and leg swelling.    Gastrointestinal: Positive for nausea. Negative for abdominal pain, diarrhea and vomiting.  Genitourinary: Negative for dysuria, flank pain, frequency, hematuria, urgency, vaginal bleeding and vaginal discharge.  Musculoskeletal: Negative for arthralgias and myalgias.  Skin: Negative for rash.  Neurological: Negative for dizziness, syncope, weakness, light-headedness, numbness and headaches.  Psychiatric/Behavioral: Negative for sleep disturbance. The patient is not nervous/anxious.      Physical Exam Updated Vital Signs BP 105/72   Pulse 94   Temp 99.6 F (37.6 C) (Oral)   Resp 14   LMP 03/27/2017   SpO2 99%   Physical Exam  Constitutional: She is oriented to person, place, and time. She appears well-developed and well-nourished.  Non-toxic appearance. No distress.  HENT:  Head: Normocephalic and atraumatic.  Nose: Nose normal.  Mouth/Throat: Oropharynx is clear and moist.  Eyes: Conjunctivae are normal. Pupils are equal, round, and reactive to light. Right eye exhibits no discharge. Left eye exhibits no discharge.  Neck: Normal range of motion. Neck supple. No JVD present. No tracheal deviation present.  No c spine midline tenderness. No paraspinal tenderness. No deformities or step offs noted. Full ROM. Supple. No nuchal rigidity.    Cardiovascular: Normal rate, regular rhythm, normal heart sounds and intact distal pulses.   Pulmonary/Chest: Effort normal and breath sounds normal. No respiratory distress. She exhibits no tenderness.  No hypoxia or tachypnea.  Abdominal: Soft. Bowel sounds are normal. She exhibits no distension. There is no tenderness. There is no rebound and no guarding.  Musculoskeletal: Normal range of motion. She exhibits no tenderness.  No lower extremity edema. No calf tenderness.   Lymphadenopathy:    She has no cervical adenopathy.  Neurological: She is alert and oriented to person, place, and time.  Skin: Skin is warm and dry. Capillary refill  takes less than 2 seconds. She is not diaphoretic.  Psychiatric: Her behavior is normal. Judgment and thought content normal.  Nursing note and vitals reviewed.    ED Treatments / Results  Labs (all labs ordered are listed, but only abnormal results are displayed) Labs Reviewed  BASIC METABOLIC PANEL - Abnormal; Notable for the following:       Result Value   Glucose, Bld 101 (*)    Calcium 8.7 (*)    All other components within normal limits  CBC WITH DIFFERENTIAL/PLATELET - Abnormal; Notable for the following:    Lymphs Abs 0.6 (*)    All other components within normal limits  I-STAT TROPOININ, ED    EKG  EKG Interpretation  Date/Time:  Wednesday April 15 2017 13:35:21 EDT Ventricular Rate:  108 PR Interval:    QRS Duration: 83 QT Interval:  320 QTC Calculation: 429 R Axis:   73 Text Interpretation:  Sinus tachycardia Low voltage, precordial leads Borderline T abnormalities, anterior leads Confirmed by Isla Pence (  [id number]) on 04/16/2017 12:59:28 PM       Radiology Ct Angio Chest Pe W/cm &/or Wo Cm  Result Date: 04/15/2017 CLINICAL DATA:  Chest pain and fever with shortness of breath. Elevated D-dimer EXAM: CT ANGIOGRAPHY CHEST WITH CONTRAST TECHNIQUE: Multidetector CT imaging of the chest was performed using the standard protocol during bolus administration of intravenous contrast. Multiplanar CT image reconstructions and MIPs were obtained to evaluate the vascular anatomy. CONTRAST:  100 mL Isovue 370 nonionic COMPARISON:  None. FINDINGS: Cardiovascular: There is no demonstrable pulmonary embolus. The main pulmonary outflow tract is prominent measuring 3.1 cm in diameter. There is no thoracic aortic aneurysm or dissection. Visualized great vessels appear unremarkable. There is no appreciable pericardial thickening. Mediastinum/Nodes: Thyroid appears unremarkable. There is no appreciable thoracic adenopathy. Lungs/Pleura: There is slight bibasilar atelectasis. There is no  lung edema or consolidation. No pleural effusion or pleural thickening evident. Upper Abdomen: Visualized upper abdominal structures appear unremarkable. Musculoskeletal: No blastic or lytic bone lesions. Review of the MIP images confirms the above findings. IMPRESSION: No demonstrable pulmonary embolus. Prominence of the main pulmonary outflow tract raises question of a degree of underlying pulmonary arterial hypertension. No thoracic aortic aneurysm or dissection. No pericardial thickening. Slight bibasilar atelectasis. No edema or consolidation. No evident adenopathy. Electronically Signed   By: Lowella Grip III M.D.   On: 04/15/2017 15:31    Procedures Procedures (including critical care time)  Medications Ordered in ED Medications  iopamidol (ISOVUE-370) 76 % injection (100 mLs  Contrast Given 04/15/17 1511)  sodium chloride 0.9 % bolus 1,000 mL (0 mLs Intravenous Stopped 04/15/17 1617)  acetaminophen (TYLENOL) tablet 1,000 mg (1,000 mg Oral Given 04/15/17 1436)  ketorolac (TORADOL) 30 MG/ML injection 15 mg (15 mg Intravenous Given 04/15/17 1604)  gi cocktail (Maalox,Lidocaine,Donnatal) (30 mLs Oral Given 04/15/17 1604)     Initial Impression / Assessment and Plan / ED Course  I have reviewed the triage vital signs and the nursing notes.  Pertinent labs & imaging results that were available during my care of the patient were reviewed by me and considered in my medical decision making (see chart for details).     Patient resents to the emergency department today after referral from urgent care for elevated d-dimer, chest pain, shortness of breath, fever. Patient is low risk for PE. On exam patient is overall well-appearing. She is not toxic. Lab work is reassuring. No leukocytosis. Electrolytes are normal. Had UA at [place] that showed no signs of infection. Troponin is negative. Given that symptoms started greater than 6 hours ago but did not feel that delta troponin is necessary. EKG shows  sinus tachycardia but no signs of ischemia or infarct. Vital signs are reassuring. Heart rate has improved with fluid and tylenol. Patient did have elevated d-dimer at urgent care. Sent here for CT scan. CT scan showed no signs of pulmonary embolism or dissection. Does note prominence of the main pulmonary outflow tract raises question of a degree of underlying pulmonary arterial hypertension. Patient updated on plan of care. Vital signs are reassuring. Patient symptoms have resolved since arrival to the ED. Given GI cocktail and Toradol. Patient will follow-up with her primary care doctor concerning visit and ct findings. Have given her strict return precautions. Patient was seen and evaluated by Dr. Alvino Chapel who is agreeable to the above plan. All questions were answered prior to discharge.  Final Clinical Impressions(s) / ED Diagnoses   Final diagnoses:  Atypical chest pain    New Prescriptions  Discharge Medication List as of 04/15/2017  4:09 PM       Doristine Devoid, PA-C 04/17/17 1030    Davonna Belling, MD 04/17/17 [phone]

## 2017-04-21 ENCOUNTER — Ambulatory Visit (INDEPENDENT_AMBULATORY_CARE_PROVIDER_SITE_OTHER): Payer: BC Managed Care – PPO | Admitting: Internal Medicine

## 2017-04-21 ENCOUNTER — Encounter: Payer: Self-pay | Admitting: Internal Medicine

## 2017-04-21 VITALS — BP 120/80 | HR 80 | Temp 98.3°F | Ht 66.0 in | Wt 225.0 lb

## 2017-04-21 DIAGNOSIS — R079 Chest pain, unspecified: Secondary | ICD-10-CM | POA: Diagnosis not present

## 2017-04-21 DIAGNOSIS — R6883 Chills (without fever): Secondary | ICD-10-CM

## 2017-04-21 DIAGNOSIS — W57XXXA Bitten or stung by nonvenomous insect and other nonvenomous arthropods, initial encounter: Secondary | ICD-10-CM | POA: Diagnosis not present

## 2017-04-21 DIAGNOSIS — R509 Fever, unspecified: Secondary | ICD-10-CM

## 2017-04-21 DIAGNOSIS — B349 Viral infection, unspecified: Secondary | ICD-10-CM

## 2017-04-21 DIAGNOSIS — R7989 Other specified abnormal findings of blood chemistry: Secondary | ICD-10-CM | POA: Diagnosis not present

## 2017-04-21 NOTE — Progress Notes (Signed)
   Subjective:    Patient ID: [name], female    DOB: 1976/07/07, 41 y.o.   MRN: [id number]  HPI 41 year old Female in today for follow-up of emergency department visit accompanied by female friend. On the evening of June 19 she developed severe left breast and left lateral chest pain. She felt nauseated. She felt some shortness of breath and had chills as well as fever of 101.2. She took Tylenol and Motrin. Had one episode of loose stool earlier in the day. Seen initially in an urgent care and reportedly had elevated d-dimer. Was told to come to the emergency department for evaluation for PE.  At her physical exam in February, I did give her birth control pill prescription but she never took that prescription after reading about side effects. Female partner has had a vasectomy.  Did not recall any excessive exercise or injury causing pain in chest. Never had chest pain previously.  Patient had CT of the chest raising possibility of pulmonary hypertension but no pulmonary embolus was noted.  Emergency Department she had  EKG showing sinus tachycardia rate 108. Troponin was negative. Tachycardia improved with fluid and Tylenol.  Patient apparently did have a couple of insect bites on left chest and left abdomen recently. Doesn't know what insects were but just salt where the bites had been. No headache at the present time. Continues to have loose stools. Feels a bit nauseated. Some of the for nausea was prescribed in the emergency department she did not pick it up. Feels weak and tired.      Review of Systems see above     Objective:   Physical Exam Skin warm and dry. Nodes none. Chest clear. Cardiac exam regular rate and rhythm normal S1 and S2. There is area of left chest area and left abdomen. These do not appear to be infected.       Assessment & Plan:  Probable viral syndrome  ? Musculoskeletal chest pain  Insect bites  Elevated d-dimer-this was repeated today  Plan:  Continue with clear liquids and advance diet slowly. Suggested patient pick up antinausea medication from pharmacy and take as needed. She will have cardiology consultation regarding question of Pulmonary hypertension noted on chest CT. Lyme and RMSF titers were drawn today. I did not put her on doxycycline since she's recovering from an apparent gastroenteritis but wanted to check those titers. CBC was repeated today along with d-dimer. Rest and drink plenty of fluids. Stay out of heat.

## 2017-04-21 NOTE — Patient Instructions (Addendum)
D-dimer, CBC, Lyme and RMSF titers are pending. Rest and drink plenty of fluids and stay out of heat. Call if symptoms worsen. Recommended Banks and diet slowly and taking antinausea medication. Cardiology consultation due to question of pulmonary hypertension raced all CT scan recently

## 2017-04-22 LAB — CBC WITH DIFFERENTIAL/PLATELET
BASOS PCT: 1 %
Basophils Absolute: 59 cells/uL (ref 0–200)
Eosinophils Absolute: 59 cells/uL (ref 15–500)
Eosinophils Relative: 1 %
HCT: 41 % (ref 35.0–45.0)
Hemoglobin: 13.3 g/dL (ref 11.7–15.5)
LYMPHS PCT: 32 %
Lymphs Abs: 1888 cells/uL (ref 850–3900)
MCH: 27.9 pg (ref 27.0–33.0)
MCHC: 32.4 g/dL (ref 32.0–36.0)
MCV: 86.1 fL (ref 80.0–100.0)
MONOS PCT: 9 %
MPV: 9.1 fL (ref 7.5–12.5)
Monocytes Absolute: 531 cells/uL (ref 200–950)
NEUTROS PCT: 57 %
Neutro Abs: 3363 cells/uL (ref 1500–7800)
PLATELETS: 353 10*3/uL (ref 140–400)
RBC: 4.76 MIL/uL (ref 3.80–5.10)
RDW: 14.3 % (ref 11.0–15.0)
WBC: 5.9 10*3/uL (ref 3.8–10.8)

## 2017-04-22 LAB — ROCKY MTN SPOTTED FVR ABS PNL(IGG+IGM)
RMSF IGM: NOT DETECTED
RMSF IgG: NOT DETECTED

## 2017-04-22 LAB — D-DIMER, QUANTITATIVE (NOT AT ARMC): D DIMER QUANT: 0.41 ug{FEU}/mL (ref <0.50)

## 2017-04-22 LAB — LYME AB/WESTERN BLOT REFLEX: B burgdorferi Ab IgG+IgM: <0.9 Index (ref <0.90)

## 2017-04-23 ENCOUNTER — Encounter: Payer: Self-pay | Admitting: Cardiology

## 2017-04-23 ENCOUNTER — Ambulatory Visit (INDEPENDENT_AMBULATORY_CARE_PROVIDER_SITE_OTHER): Payer: BC Managed Care – PPO | Admitting: Cardiology

## 2017-04-23 VITALS — BP 116/82 | HR 79 | Ht 67.5 in | Wt 230.4 lb

## 2017-04-23 DIAGNOSIS — R072 Precordial pain: Secondary | ICD-10-CM | POA: Diagnosis not present

## 2017-04-23 DIAGNOSIS — R0683 Snoring: Secondary | ICD-10-CM

## 2017-04-23 DIAGNOSIS — I272 Pulmonary hypertension, unspecified: Secondary | ICD-10-CM | POA: Diagnosis not present

## 2017-04-23 NOTE — Progress Notes (Signed)
[name] Lick, MD Reason for referral-Chest pain and possible pulmonary hypertension.  HPI: 41 year old female for evaluation of chest pain at request of Tedra Senegal, MD. patient seen in emergency room 04/15/2017 with chest pain. CTA showed no pulmonary embolus but there was prominence of the main pulmonary outflow tract raising possibility of pulmonary hypertension. Hemoglobin and troponin normal. Patient states that at the time of her evaluation she had developed fever, chills, nausea, diarrhea and general malaise. The pain was in her left breast area. It lasted approximately 12 hours and there was some improvement with laying on her back. She otherwise did not have dyspnea on exertion prior to her recent ER evaluation. There is no orthopnea, PND, pedal edema, exertional chest pain or syncope. Because of the above we were asked to evaluate.  Current Outpatient Prescriptions  Medication Sig Dispense Refill  . cyclobenzaprine (FLEXERIL) 10 MG tablet Take 10 mg by mouth 3 (three) times daily as needed for muscle spasms.    . folic acid (FOLVITE) 1 MG tablet Take 1 tablet (1 mg total) by mouth daily. 90 tablet 3  . ibuprofen (ADVIL,MOTRIN) 200 MG tablet Take 400 mg by mouth every 6 (six) hours as needed for mild pain.    Marland Kitchen loratadine (CLARITIN) 10 MG tablet Take 10 mg by mouth daily.    . simvastatin (ZOCOR) 20 MG tablet Take 1 tablet (20 mg total) by mouth daily. 90 tablet 3   No current facility-administered medications for this visit.     No Known Allergies   Past Medical History:  Diagnosis Date  . Allergy   . Genital warts   . Hyperlipidemia   . Low back pain   . Nephrolithiasis   . Vitamin D deficiency     Past Surgical History:  Procedure Laterality Date  . extraction of wisdom teeth      Social History   Social History  . Marital status: Single    Spouse name: N/A  . Number of children: N/A  . Years of education: N/A   Occupational History  .     Teacher   Social History Main Topics  . Smoking status: Former Smoker    Types: Cigarettes    Quit date: 07/23/2004  . Smokeless tobacco: Never Used  . Alcohol use Yes     Comment: socially  . Drug use: No  . Sexual activity: Yes    Partners: Male    Birth control/ protection: None   Other Topics Concern  . Not on file   Social History Narrative  . No narrative on file    Family History  Problem Relation Age of Onset  . Diabetes Mother        At Risk   . Cancer Mother        Skin   . Heart disease Father        Heart Attack   . CAD Father   . Diabetes Maternal Grandmother   . Hypertension Maternal Grandmother   . CAD Sister     ROS: no fevers or chills, productive cough, hemoptysis, dysphasia, odynophagia, melena, hematochezia, dysuria, hematuria, rash, seizure activity, orthopnea, PND, pedal edema, claudication. Remaining systems are negative.  Physical Exam:   Blood pressure 116/82, pulse 79, height 5' 7.5" (1.715 m), weight 104.5 kg (230 lb 6.4 oz), last menstrual period 03/27/2017, SpO2 99 %.  General:  Well developed/well nourished in NAD Skin warm/dry Patient not depressed No peripheral clubbing Back-normal HEENT-normal/normal eyelids Neck supple/normal carotid upstroke  bilaterally; no bruits; no JVD; no thyromegaly chest - CTA/ normal expansion CV - RRR/normal S1 and S2; no murmurs, rubs or gallops;  PMI nondisplaced Abdomen -NT/ND, no HSM, no mass, + bowel sounds, no bruit 2+ femoral pulses, no bruits Ext-no edema, chords, 2+ DP Neuro-grossly nonfocal  ECG - 04/15/2017-sinus tachycardia with nonspecific ST changes. personally reviewed  A/P  1 Possible pulmonary hypertension-we will arrange an echocardiogram to assess LV systolic/diastolic function, RV size and function and if she has tricuspid regurgitation estimate pulmonary pressures. She does not have a history of pulmonary embolus. Her CTA was negative. She has no history of lung disease and her  CT did not demonstrate this. She is not volume overloaded on examination. There may be a contribution from sleep apnea as she does snore. Possible contribution from OHS. I will ask pulmonary to evaluate for possible sleep study and if abnormal CPAP. We discussed the importance of weight loss. If she does have pulmonary hypertension on echo we will also order rheumatologic laboratories.   2 chest pain-symptoms do not sound consistent with cardiac etiology. No further ischemia evaluation.  Kirk Ruths, MD

## 2017-04-23 NOTE — Patient Instructions (Signed)
Medication Instructions:   NO CHANGE  Testing/Procedures:  Your physician has requested that you have an echocardiogram. Echocardiography is a painless test that uses sound waves to create images of your heart. It provides your doctor with information about the size and shape of your heart and how well your heart's chambers and valves are working. This procedure takes approximately one hour. There are no restrictions for this procedure.    Follow-Up:  Your physician recommends that you schedule a follow-up appointment in: AS NEEDED PENDING TEST RESULTS   REFERRAL TO PULMONARY FOR SLEEP APNEA EVALUATION

## 2017-04-23 NOTE — Progress Notes (Signed)
Many thanks. 

## 2017-05-04 ENCOUNTER — Other Ambulatory Visit: Payer: Self-pay

## 2017-05-04 ENCOUNTER — Ambulatory Visit (HOSPITAL_COMMUNITY): Payer: BC Managed Care – PPO | Attending: Cardiovascular Disease

## 2017-05-04 DIAGNOSIS — E785 Hyperlipidemia, unspecified: Secondary | ICD-10-CM | POA: Insufficient documentation

## 2017-05-04 DIAGNOSIS — I272 Pulmonary hypertension, unspecified: Secondary | ICD-10-CM

## 2017-05-04 DIAGNOSIS — R079 Chest pain, unspecified: Secondary | ICD-10-CM | POA: Insufficient documentation

## 2017-05-04 DIAGNOSIS — M545 Low back pain: Secondary | ICD-10-CM | POA: Diagnosis not present

## 2017-05-04 LAB — ECHOCARDIOGRAM COMPLETE
CHL CUP MV DEC (S): 183
E/e' ratio: 6.73
EWDT: 183 ms
FS: 28 % (ref 28–44)
IVS/LV PW RATIO, ED: 1.03
LA ID, A-P, ES: 31 mm
LADIAMINDEX: 1.44 cm/m2
LAVOL: 44.2 mL
LAVOLA4C: 43.4 mL
LAVOLIN: 20.5 mL/m2
LEFT ATRIUM END SYS DIAM: 31 mm
LV TDI E'MEDIAL: 7.83
LV e' LATERAL: 13.2 cm/s
LVEEAVG: 6.73
LVEEMED: 6.73
LVOT area: 3.46 cm2
LVOT diameter: 21 mm
Lateral S' vel: 19.6 cm/s
MV pk A vel: 63.1 m/s
MV pk E vel: 88.8 m/s
MVPG: 3 mmHg
PW: 9.47 mm — AB (ref 0.6–1.1)
TDI e' lateral: 13.2

## 2017-05-06 LAB — LYME DISEASE ABS IGG, IGM, IFA, CSF

## 2017-05-13 ENCOUNTER — Ambulatory Visit: Payer: BC Managed Care – PPO | Admitting: Cardiology

## 2017-07-02 ENCOUNTER — Encounter: Payer: Self-pay | Admitting: Internal Medicine

## 2017-07-02 ENCOUNTER — Ambulatory Visit (INDEPENDENT_AMBULATORY_CARE_PROVIDER_SITE_OTHER): Payer: BC Managed Care – PPO | Admitting: Internal Medicine

## 2017-07-02 VITALS — BP 118/68 | HR 107 | Temp 99.1°F | Resp 22 | Wt 243.0 lb

## 2017-07-02 DIAGNOSIS — H6502 Acute serous otitis media, left ear: Secondary | ICD-10-CM

## 2017-07-02 MED ORDER — METHYLPREDNISOLONE ACETATE 80 MG/ML IJ SUSP
80.0000 mg | Freq: Once | INTRAMUSCULAR | Status: AC
  Administered 2017-07-02: 80 mg via INTRAMUSCULAR

## 2017-07-02 MED ORDER — DOXYCYCLINE HYCLATE 100 MG PO TABS: 100.0000 mg | ORAL_TABLET | Freq: Two times a day (BID) | ORAL | 0 refills | Status: DC

## 2017-07-02 NOTE — Addendum Note (Signed)
Addended by: Drucilla Schmidt on: 07/02/2017 03:53 PM   Modules accepted: Orders

## 2017-07-02 NOTE — Progress Notes (Signed)
   Subjective:    Patient ID: [name], female    DOB: [birth year], 41 y.o.   MRN: [id number]  HPI Right before school started around August 22 she came down with rest or infection symptoms. 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 She developed some throat issues and some cough. Could not hear out of her left ear. [facility] She was given a flu vaccine and some over-the-counter medications without much relief. Still doesn't feel 100%. Not bringing up discolored sputum. Feels some discomfort in her left throat area.    Review of Systems see above. No fever chills or significant cough.     Objective:   Physical Exam Skin warm and dry. Nodes none. Left TM is full and pink. Right TM is slightly full. Pharynx is without significant exudate. There is a small nodule left tonsillar area that is benign neck is supple without adenopathy. Chest clear to auscultation without rales or wheezing.       Assessment & Plan:  Left serous otitis media  Acute URI  Plan: Depo-Medrol 80 mg IM. Doxycycline 100 mg twice daily for 10 days. Says she has Diflucan on hand at home should she develop Candida vaginitis while on antibiotic therapy.

## 2017-07-02 NOTE — Patient Instructions (Signed)
Take doxycycline 100 mg twice daily for 10 days. Diflucan if needed for Candida vaginitis while on antibiotic therapy. Depo-Medrol 80 mg IM.

## 2017-07-10 ENCOUNTER — Telehealth: Payer: Self-pay | Admitting: Cardiology

## 2017-07-10 NOTE — Telephone Encounter (Signed)
Needs sleep study/pulmonary eval; fu with me 6 months from previous ov Kirk Ruths

## 2017-07-10 NOTE — Telephone Encounter (Signed)
Returned call to patient she stated she had a normal echo,but wanted to make sure if she needs a follow up visit with Dr.Crenshaw.She had to cancel sleep study,but she will reschedule.Message sent to [facility] for advice.

## 2017-07-10 NOTE — Telephone Encounter (Signed)
New message   Pt calling because she had an echo done for Dr. Stanford Breed in July but was not told about when she needs to re follow up with him. She requests a call back as soon as possible.

## 2017-07-14 NOTE — Telephone Encounter (Signed)
Left message for patient to call and schedule follow up in December which is 6 months from previous visit.

## 2017-07-31 ENCOUNTER — Institutional Professional Consult (permissible substitution): Payer: BC Managed Care – PPO | Admitting: Pulmonary Disease

## 2017-10-16 ENCOUNTER — Ambulatory Visit: Payer: BC Managed Care – PPO | Admitting: Internal Medicine

## 2017-10-16 VITALS — BP 112/90 | HR 96 | Temp 98.0°F | Ht 67.5 in | Wt 234.0 lb

## 2017-10-16 DIAGNOSIS — R05 Cough: Secondary | ICD-10-CM | POA: Diagnosis not present

## 2017-10-16 DIAGNOSIS — J029 Acute pharyngitis, unspecified: Secondary | ICD-10-CM | POA: Diagnosis not present

## 2017-10-16 DIAGNOSIS — R059 Cough, unspecified: Secondary | ICD-10-CM

## 2017-10-16 DIAGNOSIS — H9202 Otalgia, left ear: Secondary | ICD-10-CM | POA: Diagnosis not present

## 2017-10-16 MED ORDER — CLARITHROMYCIN 500 MG PO TABS: 500.0000 mg | ORAL_TABLET | Freq: Two times a day (BID) | ORAL | 0 refills | Status: DC

## 2017-10-16 MED ORDER — METHYLPREDNISOLONE ACETATE 80 MG/ML IJ SUSP
80.0000 mg | Freq: Once | INTRAMUSCULAR | Status: AC
  Administered 2017-10-16: 80 mg via INTRAMUSCULAR

## 2017-10-16 NOTE — Progress Notes (Signed)
   Subjective:    Patient ID: ZAKYRA KUKUK, female    DOB: 04/22/1976, 41 y.o.   MRN: [id number]  HPI 41 year old Female complaining of protracted respiratory congestion for several weeks.  No fever or chills.  Slight sore throat.  Ear discomfort.  Cough slightly productive.  She is a Radio producer.  No flu symptoms.    Review of Systems See above  Objective:   Physical Exam Pharynx very slightly injected without exudate.  No adenopathy.  TMs clear.  Neck supple.  Chest clear to auscultation.       Assessment & Plan:  Acute URI  Plan: Since she has been symptomatic time we are going to treat her with Biaxin 500 mg twice daily for 10 days.  Depo-Medrol 80 mg IM

## 2017-10-21 ENCOUNTER — Ambulatory Visit: Payer: BC Managed Care – PPO | Admitting: Internal Medicine

## 2017-10-21 ENCOUNTER — Telehealth: Payer: Self-pay | Admitting: Internal Medicine

## 2017-10-21 ENCOUNTER — Encounter: Payer: Self-pay | Admitting: Internal Medicine

## 2017-10-21 VITALS — BP 110/88 | HR 67 | Temp 98.0°F | Ht 67.5 in | Wt 241.0 lb

## 2017-10-21 DIAGNOSIS — R195 Other fecal abnormalities: Secondary | ICD-10-CM | POA: Diagnosis not present

## 2017-10-21 DIAGNOSIS — R110 Nausea: Secondary | ICD-10-CM

## 2017-10-21 LAB — HEPATIC FUNCTION PANEL
AG Ratio: 1.8 (calc) (ref 1.0–2.5)
ALKALINE PHOSPHATASE (APISO): 74 U/L (ref 33–115)
ALT: 30 U/L — ABNORMAL HIGH (ref 6–29)
AST: 18 U/L (ref 10–30)
Albumin: 4.4 g/dL (ref 3.6–5.1)
BILIRUBIN DIRECT: 0.1 mg/dL (ref 0.0–0.2)
BILIRUBIN TOTAL: 0.5 mg/dL (ref 0.2–1.2)
Globulin: 2.4 g/dL (calc) (ref 1.9–3.7)
Indirect Bilirubin: 0.4 mg/dL (calc) (ref 0.2–1.2)
Total Protein: 6.8 g/dL (ref 6.1–8.1)

## 2017-10-21 NOTE — Progress Notes (Signed)
   Subjective:    Patient ID: [name], female    DOB: 1976/04/23, 41 y.o.   MRN: [id number]  HPI Returns today with c/o nausea with Biaxin and tan colored stools. Stools were loose but no frank diarrhea. Stopped Biaxin on Monday December 24. Alot of indigestion and some twinges of right upper quadrant pain on December 24.No vomiting. Ate alot on Monday December 24  felt nauseated most of the day starting after eating an egg biscuit for breakfast..No fever or chills.  Was seen on December 21 complaining of respiratory congestion for several weeks with slight sore throat and ear discomfort.  Was given Depo-Medrol and Biaxin.  Review of Systems see above     Objective:   Physical Exam  Constitutional: No distress.  HENT:  Right Ear: External ear normal.  Mouth/Throat: Oropharynx is clear and moist.  Left TM full  Neck: Neck supple.  Pulmonary/Chest: No respiratory distress. She has no wheezes. She has no rales.  Abdominal: Soft. Bowel sounds are normal. She exhibits no distension and no mass. There is no tenderness. There is no rebound and no guarding.  Lymphadenopathy:    She has no cervical adenopathy.  Skin: Skin is warm and dry. She is not diaphoretic.  Vitals reviewed.         Assessment & Plan:  Nausea likely due to Biaxin  tan colored stools-etiology unclear may be related to diet or biliary obstruction.  Liver panel drawn today.  Plan: Discontinue Biaxin.  She will see allergist for protracted respiratory symptoms.  Expect stools to return to normal within a couple of days.  She does not seem to be having any biliary colic/distress today.

## 2017-10-21 NOTE — Patient Instructions (Signed)
Biaxin discontinued.  Continue to monitor stools.  Liver panel drawn and pending.

## 2017-10-21 NOTE — Telephone Encounter (Signed)
Spoke with Dr. Renold Genta and she would like for patient to return to the office for a follow up visit.  Returned the call to patient.  States that she had also had some stomach cramping as well.  And, when I asked about the foods that she had ate, states that the only thing that stands out to her that she had really had different; she ate some brussel sprouts on Saturday evening.  No sweet potatoes or anything of that nature that should make her stools yellow.    Provided appointment for today at 11:45.  12/26 @ 11:45.  Patient confirmed.

## 2017-10-21 NOTE — Patient Instructions (Addendum)
Biaxin 500 mg twice daily for 10 days.  Depo-Medrol 80 mg IM

## 2017-10-21 NOTE — Telephone Encounter (Signed)
Patient started on the Biaxin on Saturday evening.  States that she was nauseous and took a total of 4 doses.  States that she was nauseous the entire time.  After 4 doses, she stopped taking it due to the nausea and her stools started turning yellow.  The nausea is finally gone and it has been 2 days since she has been off of the medicine and her stools are still yellow.  She is wanting to know if she should be concerned about this?    How long will it take for the medicine to get out of her system?  And, once it does, shouldn't her stools then go back to normal?    And, does she need to be on any other antibiotic since she wasn't able to complete this one?    Best # for contact:  [phone]  Pharmacy:  [facility] @ [address]

## 2017-11-04 ENCOUNTER — Encounter: Payer: Self-pay | Admitting: Internal Medicine

## 2017-11-04 ENCOUNTER — Ambulatory Visit: Payer: BC Managed Care – PPO | Admitting: Internal Medicine

## 2017-11-04 VITALS — BP 116/80 | HR 89 | Ht 67.5 in | Wt 248.4 lb

## 2017-11-04 DIAGNOSIS — G4733 Obstructive sleep apnea (adult) (pediatric): Secondary | ICD-10-CM | POA: Insufficient documentation

## 2017-11-04 DIAGNOSIS — E66812 Obesity, class 2: Secondary | ICD-10-CM

## 2017-11-04 NOTE — Assessment & Plan Note (Signed)
We discussed negative impact of obesity on sleep disordered breathing and encourage weight loss.

## 2017-11-04 NOTE — Patient Instructions (Signed)
Order- please schedule unattended home sleep test    Dx OSA   Please call me about 2 weeks after your sleep test for results and recommendations

## 2017-11-04 NOTE — Assessment & Plan Note (Addendum)
Tentative diagnosis based on physical exam and history.  Particular question of whether she is significantly desaturates at night enough to potentiate nocturnal hypoxemia and pulmonary hypertension. Plan-schedule sleep study.  We discussed possible treatments depending on results.

## 2017-11-04 NOTE — Progress Notes (Signed)
11/04/17-42 year old female former smoker for sleep evaluation. Medical problem list includes obesity, lumbar disc disease, ----Sleep Consult; Dr Stanford Breed-? sleep apnea. Snores lightly. Never had sleep study. Epworth score 5 At time of negative evaluation for PE this summer, CT suggested pulmonary artery enlargement which raised question of pulmonary hypertension on cardiology evaluation.  Sleep evaluation was requested considering possibility of significant desaturation at night as a possible cause for pulmonary hypertension. She is told by boyfriend that she snores some and she is aware of occasional waking during the night.  Some daytime sleepiness-2 cups of coffee in the morning.  No sleep medicines.  No parasomnias. No history of ENT surgery or lung disease. Works as [employer] which keeps her busy through the work day.  Little opportunity for naps.  Prior to Admission medications   Medication Sig Start Date End Date Taking? Authorizing Provider  cyclobenzaprine (FLEXERIL) 10 MG tablet Take 10 mg by mouth 3 (three) times daily as needed for muscle spasms.   Yes [provider]  folic acid (FOLVITE) 1 MG tablet Take 1 tablet (1 mg total) by mouth daily. 12/23/16  Yes Baxley, Cresenciano Lick, MD  ibuprofen (ADVIL,MOTRIN) 200 MG tablet Take 400 mg by mouth every 6 (six) hours as needed for mild pain.   Yes [provider]  loratadine (CLARITIN) 10 MG tablet Take 10 mg by mouth daily.   Yes [provider]  simvastatin (ZOCOR) 20 MG tablet Take 1 tablet (20 mg total) by mouth daily. 12/23/16  Yes Baxley, Cresenciano Lick, MD   Past Medical History:  Diagnosis Date  . Allergy   . Genital warts   . Hyperlipidemia   . Low back pain   . Nephrolithiasis   . Vitamin D deficiency    Past Surgical History:  Procedure Laterality Date  . extraction of wisdom teeth     Family History  Problem Relation Age of Onset  . Diabetes Mother        At Risk   . Cancer Mother        Skin    . Heart disease Father        Heart Attack   . CAD Father   . Diabetes Maternal Grandmother   . Hypertension Maternal Grandmother   . CAD Sister    Social History   Socioeconomic History  . Marital status: Single    Spouse name: Not on file  . Number of children: Not on file  . Years of education: Not on file  . Highest education level: Not on file  Social Needs  . Financial resource strain: Not on file  . Food insecurity - worry: Not on file  . Food insecurity - inability: Not on file  . Transportation needs - medical: Not on file  . Transportation needs - non-medical: Not on file  Occupational History    Comment: Teacher  Tobacco Use  . Smoking status: Former Smoker    Types: Cigarettes    Last attempt to quit: 07/23/2004    Years since quitting: 13.2  . Smokeless tobacco: Never Used  Substance and Sexual Activity  . Alcohol use: Yes    Comment: socially  . Drug use: No  . Sexual activity: Yes    Partners: Male    Birth control/protection: None  Other Topics Concern  . Not on file  Social History Narrative  . Not on file   ROS-see HPI   + = positive Constitutional:    weight loss, night sweats,  fevers, chills, +fatigue, lassitude. HEENT:    headaches, difficulty swallowing, tooth/dental problems, sore throat,       sneezing, itching, ear ache, nasal congestion, post nasal drip, snoring CV:    chest pain, orthopnea, PND, swelling in lower extremities, anasarca,                                  dizziness, palpitations Resp:   shortness of breath with exertion or at rest.                productive cough,   non-productive cough, coughing up of blood.              change in color of mucus.  wheezing.   Skin:    rash or lesions. GI:  No-   heartburn, indigestion, abdominal pain, nausea, vomiting, diarrhea,                 change in bowel habits, loss of appetite GU: dysuria, change in color of urine, no urgency or frequency.   flank pain. MS:   joint pain, stiffness,  decreased range of motion, back pain. Neuro-     nothing unusual Psych:  change in mood or affect.  depression or anxiety.   memory loss.  OBJ- Physical Exam General- Alert, Oriented, Affect-appropriate, Distress- none acute, + overweight Skin- rash-none, lesions- none, excoriation- none Lymphadenopathy- none Head- atraumatic            Eyes- Gross vision intact, PERRLA, conjunctivae and secretions clear            Ears- Hearing, canals-normal            Nose- Clear, no-Septal dev, mucus, polyps, erosion, perforation             Throat- Mallampati III , mucosa clear , drainage- none, tonsils- atrophic Neck- flexible , trachea midline, no stridor , thyroid nl, carotid no bruit Chest - symmetrical excursion , unlabored           Heart/CV- RRR , no murmur , no gallop  , no rub, nl s1 s2                           - JVD- none , edema- none, stasis changes- none, varices- none           Lung- clear to P&A, wheeze- none, cough- none , dullness-none, rub- none           Chest wall-  Abd-  Br/ Gen/ Rectal- Not done, not indicated Extrem- cyanosis- none, clubbing, none, atrophy- none, strength- nl Neuro- grossly intact to observation

## 2017-11-25 ENCOUNTER — Other Ambulatory Visit: Payer: Self-pay | Admitting: Internal Medicine

## 2017-11-25 DIAGNOSIS — Z1329 Encounter for screening for other suspected endocrine disorder: Secondary | ICD-10-CM

## 2017-11-25 DIAGNOSIS — E559 Vitamin D deficiency, unspecified: Secondary | ICD-10-CM

## 2017-11-25 DIAGNOSIS — Z0000 Encounter for general adult medical examination without abnormal findings: Secondary | ICD-10-CM

## 2017-11-25 DIAGNOSIS — E785 Hyperlipidemia, unspecified: Secondary | ICD-10-CM

## 2017-12-15 ENCOUNTER — Ambulatory Visit: Payer: BC Managed Care – PPO | Admitting: Internal Medicine

## 2017-12-15 ENCOUNTER — Encounter: Payer: Self-pay | Admitting: Internal Medicine

## 2017-12-15 VITALS — BP 110/88 | HR 100 | Temp 98.2°F | Ht 67.5 in | Wt 247.0 lb

## 2017-12-15 DIAGNOSIS — R5383 Other fatigue: Secondary | ICD-10-CM

## 2017-12-15 DIAGNOSIS — B349 Viral infection, unspecified: Secondary | ICD-10-CM

## 2017-12-15 DIAGNOSIS — R110 Nausea: Secondary | ICD-10-CM | POA: Diagnosis not present

## 2017-12-15 LAB — CBC WITH DIFFERENTIAL/PLATELET
BASOS ABS: 88 {cells}/uL (ref 0–200)
BASOS PCT: 0.9 %
EOS PCT: 1.2 %
Eosinophils Absolute: 118 cells/uL (ref 15–500)
HEMATOCRIT: 41.3 % (ref 35.0–45.0)
HEMOGLOBIN: 14.3 g/dL (ref 11.7–15.5)
LYMPHS ABS: 2411 {cells}/uL (ref 850–3900)
MCH: 28.9 pg (ref 27.0–33.0)
MCHC: 34.6 g/dL (ref 32.0–36.0)
MCV: 83.4 fL (ref 80.0–100.0)
MONOS PCT: 6.6 %
MPV: 9.7 fL (ref 7.5–12.5)
NEUTROS ABS: 6537 {cells}/uL (ref 1500–7800)
Neutrophils Relative %: 66.7 %
Platelets: 392 10*3/uL (ref 140–400)
RBC: 4.95 10*6/uL (ref 3.80–5.10)
RDW: 13.2 % (ref 11.0–15.0)
Total Lymphocyte: 24.6 %
WBC mixed population: 647 cells/uL (ref 200–950)
WBC: 9.8 10*3/uL (ref 3.8–10.8)

## 2017-12-15 LAB — POCT INFLUENZA A/B
INFLUENZA A, POC: NEGATIVE
INFLUENZA B, POC: NEGATIVE

## 2017-12-15 LAB — POCT RAPID STREP A (OFFICE): RAPID STREP A SCREEN: NEGATIVE

## 2017-12-21 ENCOUNTER — Other Ambulatory Visit: Payer: BC Managed Care – PPO | Admitting: Internal Medicine

## 2017-12-21 DIAGNOSIS — E785 Hyperlipidemia, unspecified: Secondary | ICD-10-CM

## 2017-12-21 DIAGNOSIS — Z0000 Encounter for general adult medical examination without abnormal findings: Secondary | ICD-10-CM

## 2017-12-21 DIAGNOSIS — Z1329 Encounter for screening for other suspected endocrine disorder: Secondary | ICD-10-CM

## 2017-12-21 DIAGNOSIS — E559 Vitamin D deficiency, unspecified: Secondary | ICD-10-CM

## 2017-12-21 NOTE — Patient Instructions (Addendum)
Rapid strep screen and rapid influenza test are negative.  Take Tylenol as needed for fever.  Call if not better in  2 or 3 days or sooner if worse

## 2017-12-21 NOTE — Progress Notes (Signed)
   Subjective:    Patient ID: [name], female    DOB: 1976/05/26, 42 y.o.   MRN: [id number]  HPI She feels she may be coming down with a respiratory infection.  Has had some fever malaise and fatigue.  Slight scratchy throat.  Concerned about possible flu.  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  Concerned about strep throat.    Review of Systems see above.  No vomiting or diarrhea.  No significant myalgias.     Objective:   Physical Exam  Skin warm and dry.  Pharynx slightly injected.  Rapid strep screen negative.  TMs are clear.  Neck supple.  Chest clear.  Rapid flu test is negative      Assessment & Plan:  Feel that she has a viral syndrome but not influenza.  Rest, drink plenty of fluids, may take Tylenol for fever.  Call if not better in 2-3 days  or sooner if worse.  CBC is within normal limits.

## 2017-12-22 LAB — CBC WITH DIFFERENTIAL/PLATELET
BASOS ABS: 68 {cells}/uL (ref 0–200)
Basophils Relative: 1.1 %
EOS PCT: 1.6 %
Eosinophils Absolute: 99 cells/uL (ref 15–500)
HEMATOCRIT: 41.9 % (ref 35.0–45.0)
HEMOGLOBIN: 13.9 g/dL (ref 11.7–15.5)
LYMPHS ABS: 1680 {cells}/uL (ref 850–3900)
MCH: 28.1 pg (ref 27.0–33.0)
MCHC: 33.2 g/dL (ref 32.0–36.0)
MCV: 84.8 fL (ref 80.0–100.0)
MPV: 9.7 fL (ref 7.5–12.5)
Monocytes Relative: 7.1 %
NEUTROS ABS: 3912 {cells}/uL (ref 1500–7800)
Neutrophils Relative %: 63.1 %
Platelets: 379 10*3/uL (ref 140–400)
RBC: 4.94 10*6/uL (ref 3.80–5.10)
RDW: 13.1 % (ref 11.0–15.0)
Total Lymphocyte: 27.1 %
WBC: 6.2 10*3/uL (ref 3.8–10.8)
WBCMIX: 440 {cells}/uL (ref 200–950)

## 2017-12-22 LAB — COMPLETE METABOLIC PANEL WITH GFR
AG Ratio: 1.7 (calc) (ref 1.0–2.5)
ALBUMIN MSPROF: 4.2 g/dL (ref 3.6–5.1)
ALT: 22 U/L (ref 6–29)
AST: 18 U/L (ref 10–30)
Alkaline phosphatase (APISO): 63 U/L (ref 33–115)
BILIRUBIN TOTAL: 0.4 mg/dL (ref 0.2–1.2)
BUN: 13 mg/dL (ref 7–25)
CALCIUM: 9.5 mg/dL (ref 8.6–10.2)
CHLORIDE: 103 mmol/L (ref 98–110)
CO2: 27 mmol/L (ref 20–32)
CREATININE: 0.89 mg/dL (ref 0.50–1.10)
GFR, EST AFRICAN AMERICAN: 94 mL/min/{1.73_m2} (ref >=60)
GFR, EST NON AFRICAN AMERICAN: 81 mL/min/{1.73_m2} (ref >=60)
Globulin: 2.5 g/dL (calc) (ref 1.9–3.7)
Glucose, Bld: 81 mg/dL (ref 65–99)
Potassium: 4.7 mmol/L (ref 3.5–5.3)
Sodium: 139 mmol/L (ref 135–146)
TOTAL PROTEIN: 6.7 g/dL (ref 6.1–8.1)

## 2017-12-22 LAB — VITAMIN D 25 HYDROXY (VIT D DEFICIENCY, FRACTURES): Vit D, 25-Hydroxy: 33 ng/mL (ref 30–100)

## 2017-12-22 LAB — LIPID PANEL
CHOL/HDL RATIO: 3.4 (calc) (ref <5.0)
Cholesterol: 196 mg/dL (ref <200)
HDL: 58 mg/dL (ref >50)
LDL CHOLESTEROL (CALC): 113 mg/dL — AB
NON-HDL CHOLESTEROL (CALC): 138 mg/dL — AB (ref <130)
TRIGLYCERIDES: 134 mg/dL (ref <150)

## 2017-12-22 LAB — TSH: TSH: 1.26 mIU/L

## 2017-12-25 ENCOUNTER — Encounter: Payer: Self-pay | Admitting: Internal Medicine

## 2017-12-25 ENCOUNTER — Ambulatory Visit: Payer: BC Managed Care – PPO | Admitting: Internal Medicine

## 2017-12-25 VITALS — BP 110/80 | HR 92 | Ht 68.0 in | Wt 236.0 lb

## 2017-12-25 DIAGNOSIS — E7849 Other hyperlipidemia: Secondary | ICD-10-CM | POA: Diagnosis not present

## 2017-12-25 DIAGNOSIS — Z87442 Personal history of urinary calculi: Secondary | ICD-10-CM | POA: Diagnosis not present

## 2017-12-25 DIAGNOSIS — Z0000 Encounter for general adult medical examination without abnormal findings: Secondary | ICD-10-CM | POA: Diagnosis not present

## 2017-12-25 DIAGNOSIS — Z23 Encounter for immunization: Secondary | ICD-10-CM

## 2017-12-25 DIAGNOSIS — Z6838 Body mass index (BMI) 38.0-38.9, adult: Secondary | ICD-10-CM | POA: Diagnosis not present

## 2017-12-25 LAB — POCT URINALYSIS DIPSTICK
Appearance: NORMAL
Bilirubin, UA: NEGATIVE
GLUCOSE UA: NEGATIVE
Ketones, UA: NEGATIVE
LEUKOCYTES UA: NEGATIVE
Nitrite, UA: NEGATIVE
ODOR: NORMAL
PH UA: 6 (ref 5.0–8.0)
Protein, UA: NEGATIVE
Spec Grav, UA: 1.01 (ref 1.010–1.025)
UROBILINOGEN UA: 0.2 U/dL

## 2017-12-25 NOTE — Progress Notes (Signed)
Subjective:    Patient ID: [name], female    DOB: 1975/10/31, 42 y.o.   MRN: [id number]  HPI 42 year old Female for health maintenance exam and evaluation of medical issues. Hx hyperlipidemia.  Elevated BMI of 35.88.  Seen for viral syndrome February 19 with negative flu test.  Apparently recovered within a few days.  History of symptoms of obstructive sleep apnea seen by Dr. Annamaria Boots in January 2019.  Home sleep test ordered- has yet to be done I referred her to cardiology.  In June 2018 after an episode in the emergency department with chest pain.  There was some question on CT NGO about pulmonary hypertension.  Echocardiogram was arranged.  The cardiologist felt that she might have sleep apnea.  Echo was done showing normal LV function but could not access pulmonary pressure.  She has had no further episodes.  She needs to focus on diet exercise and weight loss.  We have discussed this today and previously.  Sees GYN physician.  Sexually active.  Had STD testing by GYN Dr. Charlesetta Garibaldi.  History of HPV infection and abnormal Pap smear followed by GYN.  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  Has college degree.  Single, never married.  Does not smoke.  2-4 alcoholic drinks per week.  One brother.  Parents living.  Mother is a retired Equities trader.  Father is retired Engineer, structural.  Family history: Both parents are overweight.  Mother with history of skin cancer, diabetes, hypertension.  Father with history of MI.    Review of Systems no new complaints.  No chest pain or shortness of breath.     Objective:   Physical Exam  Constitutional: She is oriented to person, place, and time. She appears well-developed and well-nourished. No distress.  HENT:  Head: Normocephalic and atraumatic.  Right Ear: External ear normal.  Left Ear: External ear normal.  Mouth/Throat: Oropharynx is clear and moist.  Eyes: Pupils are equal, round, and reactive to light.  Conjunctivae and EOM are normal. Right eye exhibits no discharge. Left eye exhibits no discharge. No scleral icterus.  Neck: Neck supple. No JVD present. No thyromegaly present.  Cardiovascular: Normal rate, regular rhythm, normal heart sounds and intact distal pulses.  No murmur heard. Pulmonary/Chest: Effort normal and breath sounds normal. No respiratory distress. She has no wheezes. She has no rales.  Abdominal: Soft. Bowel sounds are normal. She exhibits no distension and no mass. There is no tenderness. There is no rebound and no guarding.  Genitourinary:  Genitourinary Comments: Deferred to GYN  Musculoskeletal: She exhibits no edema.  Lymphadenopathy:    She has no cervical adenopathy.  Neurological: She is alert and oriented to person, place, and time. She has normal reflexes. No cranial nerve deficit. Coordination normal.  Skin: Skin is warm and dry. No rash noted. She is not diaphoretic.  Psychiatric: She has a normal mood and affect. Her behavior is normal. Judgment and thought content normal.  Vitals reviewed.         Assessment & Plan:  Hyperlipidemia-stable on lipid-lowering medication.  LDL is 113 on medication  BMI 38-consider Dr. Leafy Ro for consultation.  TSH is normal  History of HPV infection and abnormal Pap.  Followed by GYN  Fibrocystic breast disease  History of allergic rhinitis  History of kidney stones  History of vitamin D deficiency level is 33 and normal-  Possible sleep apnea-home sleep test pending patient arranging it.  Plan:  Counseled regarding diet exercise and weight loss.  Consider Dr. Leafy Ro for consultation.  Please have home sleep study.  Continue same medications and return in 1 year or as needed.

## 2018-01-03 ENCOUNTER — Other Ambulatory Visit: Payer: Self-pay | Admitting: Internal Medicine

## 2018-01-16 NOTE — Patient Instructions (Signed)
Please continue same medications.  Consider consultation with Dr. Leafy Ro for weight loss.  Please have home sleep study.  Return in 1 year or as needed.

## 2018-01-22 ENCOUNTER — Telehealth: Payer: Self-pay | Admitting: Internal Medicine

## 2018-01-22 NOTE — Telephone Encounter (Signed)
Called patient, unable to reach left message to give [place] a call back in regards to this.

## 2018-01-25 NOTE — Telephone Encounter (Signed)
Attempted to call patient, no answer, left message for patient to call back.  

## 2018-01-26 ENCOUNTER — Telehealth: Payer: Self-pay | Admitting: Internal Medicine

## 2018-01-26 NOTE — Telephone Encounter (Signed)
Called and spoke with patient, she states that she has an appointment with Dr. Annamaria Boots on 4.12.19 that was supposed to be for the home sleep study. Patient states that she has still not had this done. I do see where this was ordered in January of 2019. I also see where the patient was inquiring about the price. [facility] can you please advise on this, thanks.

## 2018-01-26 NOTE — Telephone Encounter (Signed)
Attempted to call patient, no answer, left message for patient to call back. Will close this encounter since this is the 3rd call attempt without success.

## 2018-01-26 NOTE — Telephone Encounter (Signed)
We need to clear an overnight study with [name] first. If she agrees, then please schedule as split protocol NPSG for dx OSA.

## 2018-01-26 NOTE — Telephone Encounter (Signed)
ATC pt, no answer. Left message for pt to call back.  

## 2018-01-26 NOTE — Telephone Encounter (Signed)
Called patient, unable to reach.   CY are you ok with [place] placing the new order. Thanks.

## 2018-01-26 NOTE — Telephone Encounter (Signed)
Due to patient having [employer] state they will not cover a home study it has to be a in lab study and we would need an order to set it up

## 2018-01-28 NOTE — Telephone Encounter (Signed)
lmtcb x2 for pt. 

## 2018-01-29 NOTE — Telephone Encounter (Signed)
lmtcb x1 for pt. 

## 2018-01-29 NOTE — Telephone Encounter (Signed)
LVM for pt on cell, home and alt mobile numbers; which is pt's mother [name]) phone today to return call. X1 Please see below message from CY on why needing to speak with Pt

## 2018-01-29 NOTE — Telephone Encounter (Signed)
Pt is calling back [phone]

## 2018-02-01 NOTE — Telephone Encounter (Signed)
Called and spoke to patient. Patient stated that she has been working with Rodena Piety, [facility]. Patient stated she is going to follow up with the insurance company and then call Rodena Piety back on if she can do the home sleep study verses getting order for split night. Nothing further is needed at this time.

## 2018-02-05 ENCOUNTER — Ambulatory Visit: Payer: BC Managed Care – PPO | Admitting: Internal Medicine

## 2018-02-06 ENCOUNTER — Other Ambulatory Visit: Payer: Self-pay | Admitting: Internal Medicine

## 2018-02-18 ENCOUNTER — Other Ambulatory Visit: Payer: Self-pay | Admitting: *Deleted

## 2018-02-18 DIAGNOSIS — G4733 Obstructive sleep apnea (adult) (pediatric): Secondary | ICD-10-CM | POA: Diagnosis not present

## 2018-02-19 DIAGNOSIS — G4733 Obstructive sleep apnea (adult) (pediatric): Secondary | ICD-10-CM | POA: Diagnosis not present

## 2018-03-25 ENCOUNTER — Ambulatory Visit: Payer: BC Managed Care – PPO | Admitting: Internal Medicine

## 2018-04-05 ENCOUNTER — Telehealth: Payer: Self-pay | Admitting: Internal Medicine

## 2018-04-05 NOTE — Telephone Encounter (Signed)
Called and spoke with Patient about results and recommendations from HST.  Patient stated that she understood, but she wanted to wait until she saw CY at her 04/19/18 OV, before CPAP orders were placed.  Per CY-  Home sleep test- showed obstructive sleep apnea, averaging 8 apneas/ hour with drops in blood oxygen level. Especially since the cardiologists have been concerned, I recommend we start CPAP.  Order- new DME, new CPAP auto 5-20, mask of choice, humidifier, supplies, AirView

## 2018-04-15 ENCOUNTER — Ambulatory Visit: Payer: BC Managed Care – PPO | Admitting: Internal Medicine

## 2018-04-15 ENCOUNTER — Encounter: Payer: Self-pay | Admitting: Internal Medicine

## 2018-04-15 DIAGNOSIS — G4733 Obstructive sleep apnea (adult) (pediatric): Secondary | ICD-10-CM

## 2018-04-15 NOTE — Patient Instructions (Signed)
We suggested conservative management of your very mild sleep apnea: weight loss, elevated head of bed frame, sleep off flat of back, and talk to the dentist who fitted your Invisiline.   Please call us if we can help

## 2018-04-15 NOTE — Progress Notes (Signed)
HPI female former smoker followed for OSA, complicated by obesity, lumbar disc disease HST-02/18/2018-AHI 8.1/hour, desaturation to 86%, body weight 248.6 pounds  -----------------------------------------------------------  11/04/17-42 year old female former smoker for sleep evaluation. Medical problem list includes obesity, lumbar disc disease, ----Sleep Consult; Dr Stanford Breed-? sleep apnea. Snores lightly. Never had sleep study. Epworth score 5 At time of negative evaluation for PE this summer, CT suggested pulmonary artery enlargement which raised question of pulmonary hypertension on cardiology evaluation.  Sleep evaluation was requested considering possibility of significant desaturation at night as a possible cause for pulmonary hypertension. She is told by boyfriend that she snores some and she is aware of occasional waking during the night.  Some daytime sleepiness-2 cups of coffee in the morning.  No sleep medicines.  No parasomnias. No history of ENT surgery or lung disease. Works as [employer] which keeps her busy through the work day.  Little opportunity for naps.  04/15/2018-42 year old female former smoker followed for OSA, complicated by obesity, lumbar disc disease HST-02/18/2018-AHI 8.1/hour, desaturation to 86%, body weight 248.6 pounds -----OSA Pt had sleep study and showed need for CPAP-however patient wants to discuss options prior to setting up CPAP.  Here with boyfriend to review sleep study.  We discussed the relationship between pulmonary hypertension and OSA per discussion at previous visit. We discussed management of mild OSA.  She wants to try conservative measures.  She already wears a retainer one option is for her to discuss oral appliances with that dentist.  ROS-see HPI   + = positive Constitutional:    weight loss, night sweats, fevers, chills, +fatigue, lassitude. HEENT:    headaches, difficulty swallowing, tooth/dental problems, sore throat,   sneezing, itching, ear ache, nasal congestion, post nasal drip, snoring CV:    chest pain, orthopnea, PND, swelling in lower extremities, anasarca,                                  dizziness, palpitations Resp:   shortness of breath with exertion or at rest.                productive cough,   non-productive cough, coughing up of blood.              change in color of mucus.  wheezing.   Skin:    rash or lesions. GI:  No-   heartburn, indigestion, abdominal pain, nausea, vomiting, diarrhea,                 change in bowel habits, loss of appetite GU: dysuria, change in color of urine, no urgency or frequency.   flank pain. MS:   joint pain, stiffness, decreased range of motion, back pain. Neuro-     nothing unusual Psych:  change in mood or affect.  depression or anxiety.   memory loss.  OBJ- Physical Exam General- Alert, Oriented, Affect-appropriate, Distress- none acute, + overweight Skin- rash-none, lesions- none, excoriation- none Lymphadenopathy- none Head- atraumatic            Eyes- Gross vision intact, PERRLA, conjunctivae and secretions clear            Ears- Hearing, canals-normal            Nose- Clear, no-Septal dev, mucus, polyps, erosion, perforation             Throat- Mallampati III , mucosa clear , drainage- none, tonsils- atrophic Neck- flexible , trachea midline,  no stridor , thyroid nl, carotid no bruit Chest - symmetrical excursion , unlabored           Heart/CV- RRR , no murmur , no gallop  , no rub, nl s1 s2                           - JVD- none , edema- none, stasis changes- none, varices- none           Lung- clear to P&A, wheeze- none, cough- none , dullness-none, rub- none           Chest wall-  Abd-  Br/ Gen/ Rectal- Not done, not indicated Extrem- cyanosis- none, clubbing, none, atrophy- none, strength- nl Neuro- grossly intact to observation

## 2018-04-19 ENCOUNTER — Ambulatory Visit: Payer: BC Managed Care – PPO | Admitting: Internal Medicine

## 2018-04-19 NOTE — Assessment & Plan Note (Signed)
She realizes weight loss would be to her advantage and seems motivated to make the effort.

## 2018-04-19 NOTE — Assessment & Plan Note (Signed)
She is not interested in starting CPAP at this time.  We did discuss potential relationship between pulmonary hypertension and OSA originally raised by her cardiology visit while PE was being ruled out..  We discussed treatment options and conservative measures she can start with.  Her boyfriend understands what to watch for.

## 2018-05-03 ENCOUNTER — Encounter: Payer: Self-pay | Admitting: Internal Medicine

## 2018-05-03 ENCOUNTER — Ambulatory Visit (INDEPENDENT_AMBULATORY_CARE_PROVIDER_SITE_OTHER): Payer: BC Managed Care – PPO | Admitting: Internal Medicine

## 2018-05-03 VITALS — BP 110/80 | HR 63 | Ht 67.5 in | Wt 239.0 lb

## 2018-05-03 DIAGNOSIS — I8393 Asymptomatic varicose veins of bilateral lower extremities: Secondary | ICD-10-CM | POA: Diagnosis not present

## 2018-05-03 DIAGNOSIS — G2581 Restless legs syndrome: Secondary | ICD-10-CM

## 2018-05-03 LAB — IRON,TIBC AND FERRITIN PANEL
%SAT: 20 % (calc) (ref 16–45)
Ferritin: 27 ng/mL (ref 16–154)
Iron: 71 ug/dL (ref 40–190)
TIBC: 361 mcg/dL (calc) (ref 250–450)

## 2018-05-03 NOTE — Progress Notes (Signed)
   Subjective:    Patient ID: [name], female    DOB: 1976/04/28, 42 y.o.   MRN: [id number]  HPI Patient went to a screening for varicose veins at [facility].  She is a Radio producer.  She occasionally has some dependent edema.  She particularly had dependent edema after a long car trip to [facility].  About once a week she has symptoms consistent with restless leg syndrome.  She gets up and take some mustard and they seem to go away.  She does not have restless leg symptoms every night such as a feeling as if she should constantly move her legs.  We did check iron and 9 binding capacity today along with ferritin.  She has had a recent physical exam and CBC was normal.  She does take statin medication for hyperlipidemia.  She has a family history on her mother side of the family of varicose veins.    Review of Systems see above     Objective:   Physical Exam  She has superficial varicosities above her knees bilaterally.  Her pulses in her feet are good.      Assessment & Plan:  She probably has a mild case of restless leg syndrome.  We did check for iron deficiency today.  She does not want to be on prescription medication for that.  Mustard seems to take care of it and only happens about once a week.  History of dependent edema but this is not very often  Superficial varicosities  Plan: She says she had an ultrasound/duplex type study done at [facility].  She will see if she can get a copy of that.  I suggested she get another opinion from Vascular Surgery to see if she needs to have any intervention.

## 2018-05-03 NOTE — Patient Instructions (Signed)
Iron and iron binding capacity along with ferritin drawn.  Consider consult with vascular surgery.  Patient will let us know if she decides to proceed with this.

## 2018-05-04 ENCOUNTER — Other Ambulatory Visit: Payer: Self-pay | Admitting: Internal Medicine

## 2018-07-15 ENCOUNTER — Encounter: Payer: Self-pay | Admitting: Internal Medicine

## 2018-07-15 ENCOUNTER — Ambulatory Visit: Payer: BC Managed Care – PPO | Admitting: Internal Medicine

## 2018-07-15 VITALS — BP 110/70 | HR 105 | Temp 98.3°F | Ht 67.5 in | Wt 247.0 lb

## 2018-07-15 DIAGNOSIS — F419 Anxiety disorder, unspecified: Secondary | ICD-10-CM | POA: Diagnosis not present

## 2018-07-15 DIAGNOSIS — M545 Low back pain, unspecified: Secondary | ICD-10-CM

## 2018-07-15 DIAGNOSIS — G4709 Other insomnia: Secondary | ICD-10-CM

## 2018-07-15 DIAGNOSIS — F32A Depression, unspecified: Secondary | ICD-10-CM

## 2018-07-15 DIAGNOSIS — F439 Reaction to severe stress, unspecified: Secondary | ICD-10-CM

## 2018-07-15 DIAGNOSIS — F329 Major depressive disorder, single episode, unspecified: Secondary | ICD-10-CM

## 2018-07-15 MED ORDER — CLONAZEPAM 0.5 MG PO TABS: ORAL_TABLET | ORAL | 0 refills | Status: DC

## 2018-07-15 MED ORDER — FLUOXETINE HCL 10 MG PO CAPS: 10.0000 mg | ORAL_CAPSULE | Freq: Every day | ORAL | 0 refills | Status: DC

## 2018-07-15 NOTE — Progress Notes (Signed)
   Subjective:    Patient ID: [name], female    DOB: [birth year], 42 y.o.   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  Patient says she would like to see a specialist about this.  He denies any recent injury or strain.  Has had repeated bouts of back pain from time to time.  She had MRI of the LS spine in 2006 and had right L4-L5 disc and facet arthropathy at L5-S1.  Has not been to physical therapy.  Basically just lives with back pain but it has been worse over the past week or so.  Does not want to go to physical therapy.    Review of Systems see above-history of hyperlipidemia treated with Zocor     Objective:   Physical Exam Muscle strength in the lower extremities is normal.  Deep tendon reflexes 1+ and symmetrical in the knees.  Range of motion of the trunk is fair.  She has midline tenderness more over the lower lumbar and sacral area.  She is anxious and tearful in the office today relating her issues with anxiety which she says has been going on for some time and she basically has not talked about it.       Assessment & Plan:  Acute midline low back pain without sciatica.  Have prescribed Flexeril which she may take up to 3 times daily as needed.  I offered short course of prednisone but she declined.  She does take ibuprofen.  Offered physical therapy but she declined.  She wants to see a specialist so we have made Neurology referral.  Had MRI in 2006.  May continue with ibuprofen.  Anxiety, depression and insomnia-I prescribed Klonopin 0.5 mg to take 1/2-1 tab at bedtime.  Have prescribed Prozac 10 mg daily.  She would like to seek counseling and we  have given her names of counselors and psychiatrists to contact.  Discussion regarding being unhappy with current job.  Over the summer she apparently look for other employment.  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  Unless she does this,  she does not feel comfortable and gets anxious.  30 minutes was spent with this patient days mainly discussing her issues with anxiety depression and situational stress with job

## 2018-07-18 NOTE — Patient Instructions (Addendum)
Neurology referral has been made.  Try Klonopin at night for sleep and Prozac 10 mg daily for anxiety.  Take Flexeril for low back pain and continue with ibuprofen.  Please contact counselors who may direct you for psychiatric medication consultation in addition to counseling.

## 2018-07-23 ENCOUNTER — Telehealth: Payer: Self-pay

## 2018-07-23 NOTE — Telephone Encounter (Signed)
Patient called states she continues to have anxiety, she asked her boss if she could take some days off of work and he advised her that a letter is required from her doctor. She said her appt with the psychiatrist is in mid November, she would like to know if you want her to come in to talk with her and write her a letter?

## 2018-07-23 NOTE — Telephone Encounter (Signed)
Called patient and scheduled appt

## 2018-07-23 NOTE — Telephone Encounter (Signed)
Please make appt for Monday. She will need to have FMLA forms from her employer completed not just a letter.

## 2018-07-26 ENCOUNTER — Encounter: Payer: Self-pay | Admitting: Internal Medicine

## 2018-07-26 ENCOUNTER — Ambulatory Visit: Payer: BC Managed Care – PPO | Admitting: Internal Medicine

## 2018-07-26 VITALS — BP 120/80 | HR 80 | Ht 67.5 in | Wt 243.0 lb

## 2018-07-26 DIAGNOSIS — R4681 Obsessive-compulsive behavior: Secondary | ICD-10-CM | POA: Diagnosis not present

## 2018-07-26 DIAGNOSIS — F411 Generalized anxiety disorder: Secondary | ICD-10-CM | POA: Diagnosis not present

## 2018-07-26 DIAGNOSIS — Z23 Encounter for immunization: Secondary | ICD-10-CM

## 2018-07-26 NOTE — Progress Notes (Signed)
   Subjective:    Patient ID: [name], female    DOB: [birth year], 42 y.o.   MRN: [id number]  HPI 42 year old Female seen on September 19 complaining of back pain and wanting to be seen by Neurologist.  Referral was placed.  Has acute midline low back pain.  Have prescribed Flexeril.  She declined physical therapy.  Also, was having issues at that time with anxiety and obsessive-compulsive behavior.  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  She carries inhalers with her to the playground.  This is become really a problem for her in functioning at work.  She has a history of obsessive-compulsive behavior and checks door locks frequently etc.  She spoke with her principal and is taken a few days off work.  At last visit I gave her names of psychiatrist and psychologist to contact.  She says she cannot be seen by psychiatrist until November.  Has not contacted psychologist.  Says she is sleeping fine and has not been taking Klonopin even though I gave that to her to help her sleep and to help with anxiety.  We discussed taking a half of Klonopin in the mornings to see if that will calm her down some and help her function better.  I would like her to try that this week.  Her graph she is talking about taking the entire week off work.  She says she may need a note to be excused from work.  She is not sure about that yet.  We discussed why she felt like these issues were entirely her responsibility.  She says she is not sure.  She has an upcoming trip in March 2020 with her class and several other classes to [place] and she is already dreading the trip.  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     Review of Systems See above    Objective:   Physical Exam She was not  examined today but I spent 20 minutes speaking with her about these issues.       Assessment & Plan:  Anxiety state  Obsessive-compulsive thinking  Plan: She will take Klonopin 0.5 mg 1/2 tablet every morning and continue with Prozac.  She will contact psychologist for counseling and keep appointment with psychiatrist in November.  She will be taking a few days off work.

## 2018-07-26 NOTE — Patient Instructions (Addendum)
Take Klonopin 0.5 mg  one half tablet every morning.  Continue Prozac.  Contact psychologist for counseling and keep appointment with psychiatrist for medication consultation.

## 2018-08-02 ENCOUNTER — Other Ambulatory Visit: Payer: Self-pay | Admitting: Internal Medicine

## 2018-09-10 ENCOUNTER — Ambulatory Visit: Payer: BC Managed Care – PPO | Admitting: Diagnostic Neuroimaging

## 2018-09-13 ENCOUNTER — Ambulatory Visit: Payer: BC Managed Care – PPO | Admitting: Diagnostic Neuroimaging

## 2018-09-13 ENCOUNTER — Telehealth: Payer: Self-pay | Admitting: Diagnostic Neuroimaging

## 2018-09-13 ENCOUNTER — Encounter: Payer: Self-pay | Admitting: Diagnostic Neuroimaging

## 2018-09-13 VITALS — BP 135/85 | HR 88 | Ht 67.5 in | Wt 249.4 lb

## 2018-09-13 DIAGNOSIS — M48062 Spinal stenosis, lumbar region with neurogenic claudication: Secondary | ICD-10-CM

## 2018-09-13 NOTE — Progress Notes (Signed)
[facility]  PATIENT: [name] DOB: 1975-12-05  REFERRING CLINICIAN: M Baxley HISTORY FROM: patient  REASON FOR VISIT: new consult    HISTORICAL  CHIEF COMPLAINT:  Chief Complaint  Patient presents with  . Recurrent low back pain    rm 7, New Pt, "more freq occurrences of lower back issues caused by certain movements; more areas are affected"    HISTORY OF PRESENT ILLNESS:   42 year old female here for evaluation of low back pain.  Symptoms started around 2001.  She was having low back pain radiating to the right leg.  In 2006 she had MRI of the lumbar spine which showed disc herniation at L4-5 and severe spinal stenosis.  This was managed conservatively.  She saw a neurologist in the past but did not a spine surgeon.  Over time symptoms have gradually worsened.  Now patient having severe pain with walking short steps, sitting in low chairs, twisting or bending.  Symptoms affecting the right leg more than left.  Symptoms are affecting her quality of life.  Patient has not tried physical therapy recently.  She has used some muscle relaxers with mild relief.   REVIEW OF SYSTEMS: Full 14 system review of systems performed and negative with exception of: Ringing in ears snoring restless legs.  ALLERGIES: Allergies  Allergen Reactions  . Biaxin [Clarithromycin]     Extreme GI upset    HOME MEDICATIONS: Outpatient Medications Prior to Visit  Medication Sig Dispense Refill  . cyclobenzaprine (FLEXERIL) 10 MG tablet Take 10 mg by mouth 3 (three) times daily as needed for muscle spasms.    . fexofenadine (ALLEGRA) 30 MG/5ML suspension Take 30 mg by mouth daily.    . folic acid (FOLVITE) 1 MG tablet TAKE 1 TABLET(1 MG) BY MOUTH DAILY 90 tablet 0  . simvastatin (ZOCOR) 20 MG tablet TAKE 1 TABLET(20 MG) BY MOUTH DAILY 90 tablet 3  . clonazePAM (KLONOPIN) 0.5 MG tablet TAKE 1/2 to one tab po qhs (Patient not taking: Reported on 07/26/2018) 30 tablet 0  .  FLUoxetine (PROZAC) 10 MG capsule Take 1 capsule (10 mg total) by mouth daily. (Patient not taking: Reported on 09/13/2018) 30 capsule 0  . ibuprofen (ADVIL,MOTRIN) 200 MG tablet Take 400 mg by mouth every 6 (six) hours as needed for mild pain.    . montelukast (SINGULAIR) 10 MG tablet Take 1 tablet by mouth daily.  5   No facility-administered medications prior to visit.     PAST MEDICAL HISTORY: Past Medical History:  Diagnosis Date  . Allergy   . Genital warts   . Hyperlipidemia   . Low back pain   . Nephrolithiasis   . Vitamin D deficiency     PAST SURGICAL HISTORY: Past Surgical History:  Procedure Laterality Date  . extraction of wisdom teeth  1999    FAMILY HISTORY: Family History  Problem Relation Age of Onset  . Diabetes Mother        At Risk   . Cancer Mother        Skin   . Heart disease Father        Heart Attack   . CAD Father   . Diabetes Maternal Grandmother   . Hypertension Maternal Grandmother     SOCIAL HISTORY: Social History   Socioeconomic History  . Marital status: Single    Spouse name: Not on file  . Number of children: 0  . Years of education: Not on file  . Highest education  level: Master's degree (e.g., MA, MS, MEng, MEd, MSW, MBA)  Occupational History    Comment: Teacher [employer]  Social Needs  . Financial resource strain: Not on file  . Food insecurity:    Worry: Not on file    Inability: Not on file  . Transportation needs:    Medical: Not on file    Non-medical: Not on file  Tobacco Use  . Smoking status: Former Smoker    Types: Cigarettes    Last attempt to quit: 07/23/1998    Years since quitting: 20.1  . Smokeless tobacco: Never Used  Substance and Sexual Activity  . Alcohol use: Yes    Comment: once a week  . Drug use: No  . Sexual activity: Yes    Partners: Male    Birth control/protection: None  Lifestyle  . Physical activity:    Days per week: Not on file    Minutes per session: Not on file  . Stress:  Not on file  Relationships  . Social connections:    Talks on phone: Not on file    Gets together: Not on file    Attends religious service: Not on file    Active member of club or organization: Not on file    Attends meetings of clubs or organizations: Not on file    Relationship status: Not on file  . Intimate partner violence:    Fear of current or ex partner: Not on file    Emotionally abused: Not on file    Physically abused: Not on file    Forced sexual activity: Not on file  Other Topics Concern  . Not on file  Social History Narrative   Lives with sig other   Caffeine- coffee, 1-2 cups daily     PHYSICAL EXAM  GENERAL EXAM/CONSTITUTIONAL: Vitals:  Vitals:   09/13/18 0816  BP: 135/85  Pulse: 88  Weight: 249 lb 6.4 oz (113.1 kg)  Height: 5' 7.5" (1.715 m)     Body mass index is 38.49 kg/m. Wt Readings from Last 3 Encounters:  09/13/18 249 lb 6.4 oz (113.1 kg)  07/26/18 243 lb (110.2 kg)  07/15/18 247 lb (112 kg)     Patient is in no distress; well developed, nourished and groomed; neck is supple  CARDIOVASCULAR:  Examination of carotid arteries is normal; no carotid bruits  Regular rate and rhythm, no murmurs  Examination of peripheral vascular system by observation and palpation is normal  EYES:  Ophthalmoscopic exam of optic discs and posterior segments is normal; no papilledema or hemorrhages  Visual Acuity Screening   Right eye Left eye Both eyes  Without correction: 20/30 20/20   With correction:        MUSCULOSKELETAL:  Gait, strength, tone, movements noted in Neurologic exam below  NEUROLOGIC: MENTAL STATUS:  No flowsheet data found.  awake, alert, oriented to person, place and time  recent and remote memory intact  normal attention and concentration  language fluent, comprehension intact, naming intact  fund of knowledge appropriate  CRANIAL NERVE:   2nd - no papilledema on fundoscopic exam  2nd, 3rd, 4th, 6th -  pupils equal and reactive to light, visual fields full to confrontation, extraocular muscles intact, no nystagmus  5th - facial sensation symmetric  7th - facial strength symmetric  8th - hearing intact  9th - palate elevates symmetrically, uvula midline  11th - shoulder shrug symmetric  12th - tongue protrusion midline  MOTOR:   normal bulk and tone,  full strength in the BUE, BLE  SENSORY:   normal and symmetric to light touch, temperature, vibration  STRAIGHT LEG RAISE --> HAMSTRING TIGHTNESS; LEFT LEG RAISE TRIGGERS RIGHT LOW BACK PAIN  COORDINATION:   finger-nose-finger, fine finger movements normal  REFLEXES:   deep tendon reflexes present and symmetric  GAIT/STATION:   narrow based gait; SLOW AND CAUTIOUS     DIAGNOSTIC DATA (LABS, IMAGING, TESTING) - I reviewed patient records, labs, notes, testing and imaging myself where available.  Lab Results  Component Value Date   WBC 6.2 12/21/2017   HGB 13.9 12/21/2017   HCT 41.9 12/21/2017   MCV 84.8 12/21/2017   PLT 379 12/21/2017      Component Value Date/Time   NA 139 12/21/2017 1018   K 4.7 12/21/2017 1018   CL 103 12/21/2017 1018   CO2 27 12/21/2017 1018   GLUCOSE 81 12/21/2017 1018   BUN 13 12/21/2017 1018   CREATININE 0.89 12/21/2017 1018   CALCIUM 9.5 12/21/2017 1018   PROT 6.7 12/21/2017 1018   ALBUMIN 4.1 10/30/2016 1040   AST 18 12/21/2017 1018   ALT 22 12/21/2017 1018   ALKPHOS 57 10/30/2016 1040   BILITOT 0.4 12/21/2017 1018   GFRNONAA 81 12/21/2017 1018   GFRAA 94 12/21/2017 1018   Lab Results  Component Value Date   CHOL 196 12/21/2017   HDL 58 12/21/2017   LDLCALC 113 (H) 12/21/2017   TRIG 134 12/21/2017   CHOLHDL 3.4 12/21/2017   No results found for: HGBA1C No results found for: VITAMINB12 Lab Results  Component Value Date   TSH 1.26 12/21/2017     11/13/04 MRI lumbar spine [I reviewed images myself. There is severe spinal stenosis at L4-5; large disc herniation  directed posteriorly and to the right. -VRP]  1.  L3-4:   Diffuse broad-based shallow disk protrusion prominent left posterolaterally.  2.  L4-5:  Central to right paracentral to right posterolateral disk herniation with displacement of the thecal sac and associated with contact with the right L4 nerve root and the right L5 nerve root as described above.  Encroachment though no discrete contact with the left L5 nerve root in the left lateral recess. 3.  L5-S1:  Central broad-based annular disk bulge.    ASSESSMENT AND PLAN  42 y.o. year old female here with low back pain rating to the right leg, with muscle spasms and numbness.  Patient has history of severe spinal stenosis at L4-5 from 2006.  Symptoms have significantly worsened.  We will proceed with MRI lumbar spine for further evaluation.   Dx:  1. Spinal stenosis of lumbar region with neurogenic claudication     PLAN:  - MRI lumbar spine - then consider PT vs epidural injection vs surgery evaluation  Orders Placed This Encounter  Procedures  . MR LUMBAR SPINE WO CONTRAST   Return pending test results.    Penni Bombard, MD 32/20/2542, 7:06 AM Certified in Neurology, Neurophysiology and Neuroimaging  [facility] [address] [place], [place] [address] [phone]

## 2018-09-13 NOTE — Telephone Encounter (Signed)
MR Lumbar spine wo contrast Dr. Conley Simmonds Auth: [id number] (exp. 09/13/18 to 10/12/18). Patient is scheduled for 09/14/18 at [facility].

## 2018-09-14 ENCOUNTER — Ambulatory Visit: Payer: BC Managed Care – PPO

## 2018-09-14 DIAGNOSIS — M48062 Spinal stenosis, lumbar region with neurogenic claudication: Secondary | ICD-10-CM | POA: Diagnosis not present

## 2018-09-20 ENCOUNTER — Telehealth: Payer: Self-pay | Admitting: *Deleted

## 2018-09-20 DIAGNOSIS — M48062 Spinal stenosis, lumbar region with neurogenic claudication: Secondary | ICD-10-CM

## 2018-09-20 NOTE — Telephone Encounter (Signed)
-----   Message from Penni Bombard, MD sent at 09/17/2018  8:38 PM EST ----- Mild degenerative changes noted. Recommend conservative mgmt. Please call patient. Continue current plan. -VRP

## 2018-09-20 NOTE — Telephone Encounter (Signed)
LMVM for pt to return call.   

## 2018-09-20 NOTE — Telephone Encounter (Signed)
Spoke to pt and and relayed that her MRI results per Dr. Leta Baptist showed mild degenerative changes.  (age related changes.  Recommended conservative management.  She was ok with PT.

## 2018-09-20 NOTE — Telephone Encounter (Signed)
Pt returning RN call, can speak before 1:30 today or after 2:30 FYI

## 2018-11-01 ENCOUNTER — Other Ambulatory Visit: Payer: Self-pay | Admitting: Internal Medicine

## 2018-11-29 ENCOUNTER — Ambulatory Visit: Payer: BC Managed Care – PPO | Admitting: Internal Medicine

## 2018-11-29 ENCOUNTER — Encounter: Payer: Self-pay | Admitting: Internal Medicine

## 2018-11-29 VITALS — BP 110/80 | HR 108 | Temp 98.6°F | Ht 67.5 in | Wt 244.0 lb

## 2018-11-29 DIAGNOSIS — H6503 Acute serous otitis media, bilateral: Secondary | ICD-10-CM

## 2018-11-29 MED ORDER — AZITHROMYCIN 250 MG PO TABS: ORAL_TABLET | ORAL | 0 refills | Status: DC

## 2018-11-29 NOTE — Progress Notes (Signed)
   Subjective:    Patient ID: LISETTE MANCEBO, female    DOB: Dec 24, 1975, 43 y.o.   MRN: [id number]  HPI Seen at [facility] January 31 in [place] because of right ear pain.  had migraine headache, nausea, and vomiting. Had right temple pain and ear pressure. Was told to take ibuprofen. Headache has mostly resolved but still has ear pain.Was felt to have normal ear exam according to records.  No fever chills or sore throat.    Review of Systems see above-no headache today Patient asking for PT referral for back pain    Objective:   Physical Exam Temperature 98.6 degrees pulse 108 blood pressure 110/80 weight 244 pounds BMI 37.65  Skin warm and dry.  Nodes none.  Pharynx is clear without exudate.  Neck is supple without adenopathy.  Chest is clear.  TMs are full bilaterally and pink       Assessment & Plan:  PT referral for back pain-given-she saw Neurologist in the Fall 2019.  Had MRI showing mild degenerative changes and conservative treatment was recommended.  Had disc bulging L3-L4 with mild bi-foraminal stenosis.  Disc bulging with small rightward disc protrusion L L4-L5 with mild right and moderate left foraminal stenosis.  Leftward disc bulging with no spinal stenosis or foraminal narrowing L5-S1.  Bilateral serous otitis media  Obstructive sleep apnea-seen by Dr. Annamaria Boots 2019  Plan: Zithromax Z-PAK take 2 tablets day 1 followed by 1 tablet days 2 through 5.  Rest and drink plenty of fluids.  Call if not improving.  Has upcoming physical exam in the near future.

## 2018-12-18 NOTE — Patient Instructions (Signed)
Order placed for physical therapy for low back pain.  Take Zithromax Z-PAK as directed and call if not improving.

## 2018-12-21 ENCOUNTER — Other Ambulatory Visit: Payer: BC Managed Care – PPO | Admitting: Internal Medicine

## 2018-12-21 DIAGNOSIS — Z0000 Encounter for general adult medical examination without abnormal findings: Secondary | ICD-10-CM

## 2018-12-21 DIAGNOSIS — M519 Unspecified thoracic, thoracolumbar and lumbosacral intervertebral disc disorder: Secondary | ICD-10-CM

## 2018-12-21 DIAGNOSIS — E7849 Other hyperlipidemia: Secondary | ICD-10-CM

## 2018-12-21 DIAGNOSIS — E7800 Pure hypercholesterolemia, unspecified: Secondary | ICD-10-CM

## 2018-12-21 DIAGNOSIS — G4733 Obstructive sleep apnea (adult) (pediatric): Secondary | ICD-10-CM

## 2018-12-22 LAB — COMPLETE METABOLIC PANEL WITH GFR
AG RATIO: 1.6 (calc) (ref 1.0–2.5)
ALBUMIN MSPROF: 4.1 g/dL (ref 3.6–5.1)
ALT: 25 U/L (ref 6–29)
AST: 21 U/L (ref 10–30)
Alkaline phosphatase (APISO): 63 U/L (ref 31–125)
BUN: 12 mg/dL (ref 7–25)
CALCIUM: 9.2 mg/dL (ref 8.6–10.2)
CO2: 25 mmol/L (ref 20–32)
Chloride: 104 mmol/L (ref 98–110)
Creat: 0.84 mg/dL (ref 0.50–1.10)
GFR, EST NON AFRICAN AMERICAN: 86 mL/min/{1.73_m2} (ref >=60)
GFR, Est African American: 100 mL/min/{1.73_m2} (ref >=60)
GLOBULIN: 2.6 g/dL (ref 1.9–3.7)
Glucose, Bld: 84 mg/dL (ref 65–99)
POTASSIUM: 4.6 mmol/L (ref 3.5–5.3)
SODIUM: 139 mmol/L (ref 135–146)
Total Bilirubin: 0.4 mg/dL (ref 0.2–1.2)
Total Protein: 6.7 g/dL (ref 6.1–8.1)

## 2018-12-22 LAB — CBC WITH DIFFERENTIAL/PLATELET
ABSOLUTE MONOCYTES: 405 {cells}/uL (ref 200–950)
Basophils Absolute: 51 cells/uL (ref 0–200)
Basophils Relative: 0.9 %
EOS PCT: 2.7 %
Eosinophils Absolute: 154 cells/uL (ref 15–500)
HEMATOCRIT: 42.8 % (ref 35.0–45.0)
HEMOGLOBIN: 14.2 g/dL (ref 11.7–15.5)
LYMPHS ABS: 1613 {cells}/uL (ref 850–3900)
MCH: 28.7 pg (ref 27.0–33.0)
MCHC: 33.2 g/dL (ref 32.0–36.0)
MCV: 86.5 fL (ref 80.0–100.0)
MPV: 9.6 fL (ref 7.5–12.5)
Monocytes Relative: 7.1 %
NEUTROS ABS: 3477 {cells}/uL (ref 1500–7800)
Neutrophils Relative %: 61 %
Platelets: 386 10*3/uL (ref 140–400)
RBC: 4.95 10*6/uL (ref 3.80–5.10)
RDW: 13.2 % (ref 11.0–15.0)
Total Lymphocyte: 28.3 %
WBC: 5.7 10*3/uL (ref 3.8–10.8)

## 2018-12-22 LAB — VITAMIN D 25 HYDROXY (VIT D DEFICIENCY, FRACTURES): VIT D 25 HYDROXY: 27 ng/mL — AB (ref 30–100)

## 2018-12-22 LAB — LIPID PANEL
CHOL/HDL RATIO: 3 (calc) (ref <5.0)
Cholesterol: 173 mg/dL (ref <200)
HDL: 57 mg/dL (ref >=50)
LDL Cholesterol (Calc): 95 mg/dL (calc)
NON-HDL CHOLESTEROL (CALC): 116 mg/dL (ref <130)
Triglycerides: 112 mg/dL (ref <150)

## 2018-12-22 LAB — TSH: TSH: 1.21 m[IU]/L

## 2018-12-28 ENCOUNTER — Other Ambulatory Visit: Payer: Self-pay

## 2018-12-28 ENCOUNTER — Ambulatory Visit (INDEPENDENT_AMBULATORY_CARE_PROVIDER_SITE_OTHER): Payer: BC Managed Care – PPO | Admitting: Internal Medicine

## 2018-12-28 VITALS — HR 110 | Ht 68.0 in | Wt 244.0 lb

## 2018-12-28 DIAGNOSIS — Z6837 Body mass index (BMI) 37.0-37.9, adult: Secondary | ICD-10-CM | POA: Diagnosis not present

## 2018-12-28 DIAGNOSIS — Z0000 Encounter for general adult medical examination without abnormal findings: Secondary | ICD-10-CM

## 2018-12-28 DIAGNOSIS — E559 Vitamin D deficiency, unspecified: Secondary | ICD-10-CM

## 2018-12-28 DIAGNOSIS — N893 Dysplasia of vagina, unspecified: Secondary | ICD-10-CM | POA: Insufficient documentation

## 2018-12-28 DIAGNOSIS — Z8739 Personal history of other diseases of the musculoskeletal system and connective tissue: Secondary | ICD-10-CM

## 2018-12-28 DIAGNOSIS — A630 Anogenital (venereal) warts: Secondary | ICD-10-CM | POA: Insufficient documentation

## 2018-12-28 DIAGNOSIS — Z8669 Personal history of other diseases of the nervous system and sense organs: Secondary | ICD-10-CM

## 2018-12-28 DIAGNOSIS — M5416 Radiculopathy, lumbar region: Secondary | ICD-10-CM

## 2018-12-28 DIAGNOSIS — E785 Hyperlipidemia, unspecified: Secondary | ICD-10-CM | POA: Diagnosis not present

## 2018-12-28 DIAGNOSIS — N898 Other specified noninflammatory disorders of vagina: Secondary | ICD-10-CM | POA: Insufficient documentation

## 2018-12-28 DIAGNOSIS — B977 Papillomavirus as the cause of diseases classified elsewhere: Secondary | ICD-10-CM | POA: Insufficient documentation

## 2018-12-28 DIAGNOSIS — N9089 Other specified noninflammatory disorders of vulva and perineum: Secondary | ICD-10-CM | POA: Insufficient documentation

## 2018-12-28 DIAGNOSIS — Z87442 Personal history of urinary calculi: Secondary | ICD-10-CM

## 2018-12-28 LAB — POCT URINALYSIS DIPSTICK
Appearance: NEGATIVE
BILIRUBIN UA: NEGATIVE
Blood, UA: NEGATIVE
Glucose, UA: NEGATIVE
Ketones, UA: NEGATIVE
Leukocytes, UA: NEGATIVE
Nitrite, UA: NEGATIVE
Odor: NEGATIVE
Protein, UA: NEGATIVE
Spec Grav, UA: 1.01 (ref 1.010–1.025)
Urobilinogen, UA: 0.2 E.U./dL
pH, UA: 6.5 (ref 5.0–8.0)

## 2018-12-28 NOTE — Progress Notes (Signed)
Subjective:    Patient ID: [name], female    DOB: 22-Mar-1976, 43 y.o.   MRN: [id number]  HPI 43 year old Female in today for health maintenance exam and evaluation of medical issues.  History of hyperlipidemia.  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  She has seen counselor and this seems to have resolved.  She needs to focus on diet exercise and weight loss.  We have reinforced this multiple times.  In November 2019 she was seen by neurologist for spinal stenosis of the lumbar spine.  She was found to have  spinal stenosis L4-L5 on MRI.  Conservative management was recommended and she went to PT.  Dr.  Charlesetta Garibaldi does GYN exam.  She had sleep study April 2019 showing obstructive sleep apnea.  CPAP was recommended.  History of HPV infection and abnormal Pap smear followed by GYN.  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  Has a college degree.  Single.  Never married.  Does not smoke.  2-4 alcoholic drinks per week.  One brother.  Parents living.  Mother is retired Equities trader.  Father is retired Engineer, structural.  Family history: Both parents are overweight and mother has history of diabetes hypertension and skin cancer.  Father with history of MI.        Review of Systems  Constitutional: Negative.   Respiratory: Negative.   Cardiovascular: Negative.   Gastrointestinal: Negative.   Genitourinary: Negative.   Neurological: Negative.   Psychiatric/Behavioral:       Anxiety level has improved greatly       Objective:   Physical Exam Vitals signs reviewed.  Constitutional:      Appearance: Normal appearance. She is obese.  HENT:     Head: Normocephalic and atraumatic.     Right Ear: Tympanic membrane normal.     Left Ear: Tympanic membrane normal.     Nose: Nose normal.     Mouth/Throat:     Mouth: Mucous membranes are moist.     Pharynx: Oropharynx is clear.  Eyes:     General: No scleral  icterus.       Right eye: No discharge.        Left eye: No discharge.     Conjunctiva/sclera: Conjunctivae normal.     Pupils: Pupils are equal, round, and reactive to light.  Neck:     Musculoskeletal: Neck supple.     Comments: No thyromegaly Cardiovascular:     Rate and Rhythm: Normal rate and regular rhythm.     Heart sounds: Normal heart sounds. No murmur.     Comments: Breast without masses Pulmonary:     Effort: Pulmonary effort is normal. No respiratory distress.     Breath sounds: Normal breath sounds. No stridor. No wheezing, rhonchi or rales.  Abdominal:     General: Bowel sounds are normal.     Palpations: Abdomen is soft. There is no mass.     Tenderness: There is no abdominal tenderness. There is no guarding or rebound.  Genitourinary:    Comments: Deferred to GYN Musculoskeletal:        General: No deformity.     Right lower leg: No edema.     Left lower leg: No edema.  Lymphadenopathy:     Cervical: No cervical adenopathy.  Skin:    General: Skin is warm and dry.     Findings: No rash.  Neurological:  General: No focal deficit present.     Mental Status: She is alert and oriented to person, place, and time.     Cranial Nerves: No cranial nerve deficit.     Motor: No weakness.     Coordination: Coordination normal.     Gait: Gait normal.  Psychiatric:        Mood and Affect: Mood normal.        Behavior: Behavior normal.        Thought Content: Thought content normal.           Assessment & Plan:  History of radiculopathy L4-L5-improved with physical therapy.  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  BMI 37  History of kidney stones  History of vitamin D deficiency-vitamin D level is low at 27-reminded to take supplement  History of sleep apnea-CPAP has been recommended  History of allergic rhinitis  History of  hyperlipidemia treated with simvastatin and has normal lipid panel  Plan: Return in 1 year or as needed.

## 2019-01-02 ENCOUNTER — Other Ambulatory Visit: Payer: Self-pay | Admitting: Internal Medicine

## 2019-01-04 ENCOUNTER — Encounter: Payer: Self-pay | Admitting: Internal Medicine

## 2019-01-04 ENCOUNTER — Other Ambulatory Visit: Payer: Self-pay | Admitting: Obstetrics and Gynecology

## 2019-01-04 LAB — HM MAMMOGRAPHY

## 2019-01-25 ENCOUNTER — Encounter: Payer: Self-pay | Admitting: Internal Medicine

## 2019-01-25 NOTE — Patient Instructions (Addendum)
It was a pleasure to see you today.  Return in 1 year or as needed.  Continue statin medication.  Recommend Dr. Migdalia Dk clinic for weight loss.  Please try to diet and exercise.

## 2019-01-31 ENCOUNTER — Other Ambulatory Visit: Payer: Self-pay | Admitting: Internal Medicine

## 2019-01-31 MED ORDER — FOLIC ACID 1 MG PO TABS: ORAL_TABLET | ORAL | 3 refills | Status: DC

## 2019-01-31 NOTE — Telephone Encounter (Signed)
Faxed Medication refill request  [facility] [place]  folic acid (FOLVITE) 1 MG tablet   Last refill 11-01-18 Last OV 12/28/2018 CPE

## 2019-05-26 ENCOUNTER — Ambulatory Visit (INDEPENDENT_AMBULATORY_CARE_PROVIDER_SITE_OTHER): Payer: BC Managed Care – PPO | Admitting: Internal Medicine

## 2019-05-26 ENCOUNTER — Other Ambulatory Visit: Payer: Self-pay

## 2019-05-26 DIAGNOSIS — Z20828 Contact with and (suspected) exposure to other viral communicable diseases: Secondary | ICD-10-CM

## 2019-05-26 DIAGNOSIS — Z20822 Contact with and (suspected) exposure to covid-19: Secondary | ICD-10-CM

## 2019-05-27 ENCOUNTER — Encounter: Payer: Self-pay | Admitting: Internal Medicine

## 2019-05-27 NOTE — Progress Notes (Signed)
   Subjective:    Patient ID: [name], female    DOB: 1976-07-11, 42 y.o.   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  She is agreeable to visit today in this format.  She is identified as [name], [name] using two identifiers and is a patient in this practice.  Patient tells me that she and her boyfriend live together rented a house recently and his brother came for a visit there.  Patient says she tried to social distance from boyfriend's brother.  They were in the house for several days.   Subsequently, boyfriend's brother has gone home and feels that he has symptoms of COVID-19.  Apparently some of his family has tested positive.  Patient is now alarmed about this.  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  She would like to be tested for COVID-19.  Explained to her that is taking about 2 weeks to get these test results back however  we will arrange for her to be tested at the [facility].  She is interested in having her boyfriend tested as well but he is not a patient here.  In the meantime they should remain quarantined and notify me if she develops symptoms.   Review of Systems     Objective:   Physical Exam  Currently asymptomatic but worried and denies fever      Assessment & Plan:  COVID-19 exposure  Plan: Arrange for testing at [facility]  15 minutes spent with patient

## 2019-05-27 NOTE — Patient Instructions (Signed)
Arrange for testing at [facility] for COVID-19.  In the meantime quarantined at home

## 2019-05-29 LAB — NOVEL CORONAVIRUS, NAA: SARS-CoV-2, NAA: NOT DETECTED

## 2019-06-03 ENCOUNTER — Encounter: Payer: Self-pay | Admitting: Internal Medicine

## 2019-06-03 ENCOUNTER — Ambulatory Visit (INDEPENDENT_AMBULATORY_CARE_PROVIDER_SITE_OTHER): Payer: BC Managed Care – PPO | Admitting: Internal Medicine

## 2019-06-03 ENCOUNTER — Telehealth: Payer: Self-pay | Admitting: Internal Medicine

## 2019-06-03 VITALS — Temp 98.8°F | Ht 68.0 in

## 2019-06-03 DIAGNOSIS — J069 Acute upper respiratory infection, unspecified: Secondary | ICD-10-CM

## 2019-06-03 NOTE — Patient Instructions (Signed)
Continue to monitor symptoms and take tab daily.  Call if not improving next week.

## 2019-06-03 NOTE — Telephone Encounter (Signed)
After talking with Dr Renold Genta she suggested virtual visit.  Scheduled Virtual Visit

## 2019-06-03 NOTE — Telephone Encounter (Signed)
[name] [id number]  [name] called to say that she test negative on her COVID test, however she is now having congestion, fever 99.1 and just feels bad. Does she need to be retested or what should she do. They are talking about her potentially having to report to work soon.

## 2019-06-03 NOTE — Progress Notes (Signed)
   Subjective:    Patient ID: GAIGE FUSSNER, female    DOB: 1976/10/12, 43 y.o.   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  We saw her virtually July 30th regarding a Covid exposure where boyfriend's brother tested positive for Covid 19 and all 3 stayed in the same rented house for a few days. Pt has developed respiratory symptoms over the past few days. Had low grade fever and highest temp was 99.6 degrees but now that has decreased. Feeling better today but has some sinus congestion. Some slightly discolored post nasal drip.  She was tested July 30 for Covid 19 at testing center on [facility] and test was negative. I notified her on August 2nd of her result.   She is seen today by interactive audio [employer] video telecommunications due to the Coronavirus pandemic.  She is identified using 2 identifiers as Noni E. Packham, a patient in this practice and she gives consent to visit in this format today.    Review of Systems No fever chills myalgias, nausea or vomiting     Objective:   Physical Exam  Not examined No acute distress but sounds a bit nasally congested on virtual visit.       Assessment & Plan:  Upper respiratory infection  Tested negative for COVID-19 July 30  Plan: Patient will continue to monitor symptoms and call if not improving or worse next week.

## 2019-09-02 ENCOUNTER — Ambulatory Visit (INDEPENDENT_AMBULATORY_CARE_PROVIDER_SITE_OTHER): Payer: BC Managed Care – PPO | Admitting: Internal Medicine

## 2019-09-02 ENCOUNTER — Other Ambulatory Visit: Payer: Self-pay

## 2019-09-02 ENCOUNTER — Encounter: Payer: Self-pay | Admitting: Internal Medicine

## 2019-09-02 DIAGNOSIS — Z23 Encounter for immunization: Secondary | ICD-10-CM | POA: Diagnosis not present

## 2019-09-02 NOTE — Progress Notes (Signed)
Flu vaccine per CMA 

## 2019-09-02 NOTE — Patient Instructions (Addendum)
Patient received a flu vaccine IM R deltoid, AV, CMA  

## 2019-12-22 ENCOUNTER — Ambulatory Visit: Payer: BC Managed Care – PPO

## 2019-12-27 ENCOUNTER — Other Ambulatory Visit: Payer: Self-pay

## 2019-12-27 ENCOUNTER — Other Ambulatory Visit: Payer: BC Managed Care – PPO | Admitting: Internal Medicine

## 2019-12-27 DIAGNOSIS — Z1329 Encounter for screening for other suspected endocrine disorder: Secondary | ICD-10-CM

## 2019-12-27 DIAGNOSIS — E559 Vitamin D deficiency, unspecified: Secondary | ICD-10-CM

## 2019-12-27 DIAGNOSIS — Z0000 Encounter for general adult medical examination without abnormal findings: Secondary | ICD-10-CM

## 2019-12-27 DIAGNOSIS — M5416 Radiculopathy, lumbar region: Secondary | ICD-10-CM

## 2019-12-27 DIAGNOSIS — E785 Hyperlipidemia, unspecified: Secondary | ICD-10-CM

## 2019-12-28 LAB — CBC WITH DIFFERENTIAL/PLATELET
Absolute Monocytes: 547 cells/uL (ref 200–950)
Basophils Absolute: 64 cells/uL (ref 0–200)
Basophils Relative: 0.9 %
Eosinophils Absolute: 121 cells/uL (ref 15–500)
Eosinophils Relative: 1.7 %
HCT: 43 % (ref 35.0–45.0)
Hemoglobin: 14.3 g/dL (ref 11.7–15.5)
Lymphs Abs: 1725 cells/uL (ref 850–3900)
MCH: 28.4 pg (ref 27.0–33.0)
MCHC: 33.3 g/dL (ref 32.0–36.0)
MCV: 85.5 fL (ref 80.0–100.0)
MPV: 9.9 fL (ref 7.5–12.5)
Monocytes Relative: 7.7 %
Neutro Abs: 4643 cells/uL (ref 1500–7800)
Neutrophils Relative %: 65.4 %
Platelets: 361 10*3/uL (ref 140–400)
RBC: 5.03 10*6/uL (ref 3.80–5.10)
RDW: 13 % (ref 11.0–15.0)
Total Lymphocyte: 24.3 %
WBC: 7.1 10*3/uL (ref 3.8–10.8)

## 2019-12-28 LAB — COMPLETE METABOLIC PANEL WITH GFR
AG Ratio: 1.5 (calc) (ref 1.0–2.5)
ALT: 24 U/L (ref 6–29)
AST: 19 U/L (ref 10–30)
Albumin: 4.1 g/dL (ref 3.6–5.1)
Alkaline phosphatase (APISO): 66 U/L (ref 31–125)
BUN: 13 mg/dL (ref 7–25)
CO2: 26 mmol/L (ref 20–32)
Calcium: 9.4 mg/dL (ref 8.6–10.2)
Chloride: 104 mmol/L (ref 98–110)
Creat: 0.91 mg/dL (ref 0.50–1.10)
GFR, Est African American: 90 mL/min/{1.73_m2} (ref >=60)
GFR, Est Non African American: 78 mL/min/{1.73_m2} (ref >=60)
Globulin: 2.7 g/dL (calc) (ref 1.9–3.7)
Glucose, Bld: 88 mg/dL (ref 65–99)
Potassium: 4.6 mmol/L (ref 3.5–5.3)
Sodium: 140 mmol/L (ref 135–146)
Total Bilirubin: 0.5 mg/dL (ref 0.2–1.2)
Total Protein: 6.8 g/dL (ref 6.1–8.1)

## 2019-12-28 LAB — LIPID PANEL
Cholesterol: 189 mg/dL (ref <200)
HDL: 63 mg/dL (ref >=50)
LDL Cholesterol (Calc): 106 mg/dL (calc) — ABNORMAL HIGH
Non-HDL Cholesterol (Calc): 126 mg/dL (calc) (ref <130)
Total CHOL/HDL Ratio: 3 (calc) (ref <5.0)
Triglycerides: 105 mg/dL (ref <150)

## 2019-12-28 LAB — VITAMIN D 25 HYDROXY (VIT D DEFICIENCY, FRACTURES): Vit D, 25-Hydroxy: 22 ng/mL — ABNORMAL LOW (ref 30–100)

## 2019-12-28 LAB — TSH: TSH: 1.6 mIU/L

## 2019-12-29 ENCOUNTER — Other Ambulatory Visit: Payer: Self-pay

## 2019-12-29 ENCOUNTER — Encounter: Payer: Self-pay | Admitting: Internal Medicine

## 2019-12-29 ENCOUNTER — Ambulatory Visit (INDEPENDENT_AMBULATORY_CARE_PROVIDER_SITE_OTHER): Payer: BC Managed Care – PPO | Admitting: Internal Medicine

## 2019-12-29 VITALS — BP 120/80 | HR 119 | Temp 98.0°F | Ht 68.0 in | Wt 247.0 lb

## 2019-12-29 DIAGNOSIS — Z87442 Personal history of urinary calculi: Secondary | ICD-10-CM | POA: Diagnosis not present

## 2019-12-29 DIAGNOSIS — E559 Vitamin D deficiency, unspecified: Secondary | ICD-10-CM | POA: Diagnosis not present

## 2019-12-29 DIAGNOSIS — E7800 Pure hypercholesterolemia, unspecified: Secondary | ICD-10-CM

## 2019-12-29 DIAGNOSIS — J309 Allergic rhinitis, unspecified: Secondary | ICD-10-CM

## 2019-12-29 DIAGNOSIS — Z8739 Personal history of other diseases of the musculoskeletal system and connective tissue: Secondary | ICD-10-CM

## 2019-12-29 DIAGNOSIS — Z8669 Personal history of other diseases of the nervous system and sense organs: Secondary | ICD-10-CM | POA: Diagnosis not present

## 2019-12-29 DIAGNOSIS — Z6837 Body mass index (BMI) 37.0-37.9, adult: Secondary | ICD-10-CM

## 2019-12-29 DIAGNOSIS — Z0000 Encounter for general adult medical examination without abnormal findings: Secondary | ICD-10-CM | POA: Diagnosis not present

## 2019-12-29 DIAGNOSIS — Z8659 Personal history of other mental and behavioral disorders: Secondary | ICD-10-CM | POA: Diagnosis not present

## 2019-12-29 DIAGNOSIS — Z8619 Personal history of other infectious and parasitic diseases: Secondary | ICD-10-CM | POA: Diagnosis not present

## 2019-12-29 DIAGNOSIS — I8393 Asymptomatic varicose veins of bilateral lower extremities: Secondary | ICD-10-CM

## 2019-12-29 MED ORDER — ERGOCALCIFEROL 1.25 MG (50000 UT) PO CAPS: 50000.0000 [IU] | ORAL_CAPSULE | ORAL | 1 refills | Status: DC

## 2019-12-29 NOTE — Progress Notes (Signed)
   Subjective:    Patient ID: [name], female    DOB: 1976-06-20, 44 y.o.   MRN: [id number]  HPI    Review of Systems     Objective:   Physical Exam        Assessment & Plan:

## 2019-12-29 NOTE — Progress Notes (Signed)
   Subjective:    Patient ID: [name], female    DOB: August 14, 1976, 44 y.o.   MRN: [id number]  HPI 44 year old Female in today for health maintenance exam and evaluation of medical issues.  History of HPV and abnormal Pap smear followed by GYN.  She had sleep study April 2019 showed obstructive sleep apnea.  CPAP recommended.  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  She saw a Social worker.  This has improved.  History of hyperlipidemia and obesity.  History of vitamin D deficiency  In November 2019 she was seen by neurologist for spinal stenosis of the lumbar spine.  Was found to have spinal stenosis L4-L5 on MRI and conservative management was recommended.  She with physical therapy.  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  Single never married.  Does not smoke.  2-4 alcoholic drinks per week.  1 brother.  Parents living.  Mother is a retired Marine scientist.  Father is a retired Engineer, structural.  She has steady female partner.  Family history: Both parents are overweight.  Mother with history of diabetes hypertension and skin cancer.  Father with history of MI.   Review of Systems  Respiratory: Negative.   Gastrointestinal: Negative.   Genitourinary: Negative.   Neurological: Negative.   Psychiatric/Behavioral: Negative.        Objective:   Physical Exam  Blood pressure 120/80, pulse 119 and regular, BMI 37.56, weight 247 pounds, height 5 feet 8 inches  Skin warm and dry.  Nodes none.  TMs are clear.  Pharynx is clear.  Neck is supple without thyromegaly.  Chest clear to auscultation.  Breast without masses.  Cardiac exam tachycardia regular rate and rhythm.  Abdomen obese soft nondistended without hepatosplenomegaly masses or tenderness.  No lower extremity pitting edema.  GYN exam deferred to GYN physician.  Neuro no focal deficits.  Affect thought and judgment are normal.  Superficial varicosities lower  extremities.      Assessment & Plan:  BMI 37.56-have discussed weight loss options.  Not interested in bypass surgery.  Recommend Dr. Migdalia Dk clinic.  Hyperlipidemia-stable on Zocor 20 mg daily  History of allergic rhinitis treated with Allegra and Singulair  Vitamin D level is low at 22--patient will be placed on high-dose vitamin D weekly 50,000 units  History of anxiety-improved  Plan: Return in 1 year or as needed.  Consider Dr. Migdalia Dk clinic for weight loss.  Needs to try harder to lose weight.  Needs regular exercise.  Obesity runs in family.  Has been placed on weekly high-dose vitamin D for vitamin D deficiency.  Continue with Zocor 20 mg daily for hyperlipidemia.  Continue Allegra and Singulair for allergic rhinitis.

## 2020-01-03 LAB — POCT URINALYSIS DIPSTICK
Appearance: NEGATIVE
Bilirubin, UA: NEGATIVE
Blood, UA: NEGATIVE
Glucose, UA: NEGATIVE
Ketones, UA: NEGATIVE
Leukocytes, UA: NEGATIVE
Nitrite, UA: NEGATIVE
Odor: NEGATIVE
Protein, UA: NEGATIVE
Spec Grav, UA: 1.01 (ref 1.010–1.025)
Urobilinogen, UA: 0.2 E.U./dL
pH, UA: 6.5 (ref 5.0–8.0)

## 2020-01-22 NOTE — Patient Instructions (Signed)
It was a pleasure to see you today.  Consider Dr. Migdalia Dk clinic for weight loss.  Continue to work on diet exercise and weight loss.  Continue current medications.  Return in 1 year or as needed.

## 2020-02-01 ENCOUNTER — Other Ambulatory Visit: Payer: Self-pay | Admitting: Internal Medicine

## 2020-02-01 MED ORDER — FOLIC ACID 1 MG PO TABS: ORAL_TABLET | ORAL | 3 refills | Status: DC

## 2020-02-01 NOTE — Telephone Encounter (Signed)
Received Fax RX request from  [place], [address] AT [address] Phone:  [phone]  Fax:  [phone]       Medication - folic acid (FOLVITE) 1 MG tablet   Last Refill - 11/05/19  Last OV - 12/29/19  Last CPE - 12/29/19  Next Appointment -

## 2020-02-01 NOTE — Telephone Encounter (Signed)
Refill x one year °

## 2020-02-13 ENCOUNTER — Other Ambulatory Visit: Payer: Self-pay

## 2020-02-13 MED ORDER — SIMVASTATIN 20 MG PO TABS: ORAL_TABLET | ORAL | 3 refills | Status: DC

## 2020-03-09 DIAGNOSIS — M722 Plantar fascial fibromatosis: Secondary | ICD-10-CM | POA: Insufficient documentation

## 2020-03-19 NOTE — Progress Notes (Signed)
HPI: Follow-up chest pain. Last seen April 23, 2017. Patient seen in emergency room 04/15/2017 with chest pain. CTA showed no pulmonary embolus but there was prominence of the main pulmonary outflow tract raising possibility of pulmonary hypertension. Hemoglobin and troponin normal. Echocardiogram July 2018 showed normal LV function, mild right ventricular enlargement. Home sleep study April 2019 showed obstructive sleep apnea and CPAP was initiated.  Since last seen she has dyspnea with more vigorous activities but not routine activities.  No orthopnea, PND, pedal edema, chest pain or syncope.  Current Outpatient Medications  Medication Sig Dispense Refill  . cyclobenzaprine (FLEXERIL) 10 MG tablet Take 10 mg by mouth 3 (three) times daily as needed for muscle spasms.    . ergocalciferol (DRISDOL) 1.25 MG (50000 UT) capsule Take 1 capsule (50,000 Units total) by mouth once a week. 12 capsule 1  . fexofenadine (ALLEGRA) 30 MG/5ML suspension Take 30 mg by mouth daily.    . folic acid (FOLVITE) 1 MG tablet TAKE 1 TABLET(1 MG) BY MOUTH DAILY 90 tablet 3  . montelukast (SINGULAIR) 10 MG tablet Take 1 tablet by mouth daily.  5  . simvastatin (ZOCOR) 20 MG tablet TAKE 1 TABLET(20 MG) BY MOUTH DAILY 90 tablet 3   No current facility-administered medications for this visit.     Past Medical History:  Diagnosis Date  . Allergy   . Genital warts   . Hyperlipidemia   . Low back pain   . Nephrolithiasis   . Vitamin D deficiency     Past Surgical History:  Procedure Laterality Date  . extraction of wisdom teeth  1999    Social History   Socioeconomic History  . Marital status: Single    Spouse name: Not on file  . Number of children: 0  . Years of education: Not on file  . Highest education level: Master's degree (e.g., MA, MS, MEng, MEd, MSW, MBA)  Occupational History    Comment: Teacher [employer]  Tobacco Use  . Smoking status: Former Smoker    Types: Cigarettes    Quit  date: 07/23/1998    Years since quitting: 21.6  . Smokeless tobacco: Never Used  Substance and Sexual Activity  . Alcohol use: Yes    Comment: once a week  . Drug use: No  . Sexual activity: Yes    Partners: Male    Birth control/protection: None  Other Topics Concern  . Not on file  Social History Narrative   Lives with sig other   Caffeine- coffee, 1-2 cups daily   Social Determinants of Health   Financial Resource Strain:   . Difficulty of Paying Living Expenses:   Food Insecurity:   . Worried About Charity fundraiser in the Last Year:   . Arboriculturist in the Last Year:   Transportation Needs:   . Film/video editor (Medical):   Marland Kitchen Lack of Transportation (Non-Medical):   Physical Activity:   . Days of Exercise per Week:   . Minutes of Exercise per Session:   Stress:   . Feeling of Stress :   Social Connections:   . Frequency of Communication with Friends and Family:   . Frequency of Social Gatherings with Friends and Family:   . Attends Religious Services:   . Active Member of Clubs or Organizations:   . Attends Archivist Meetings:   Marland Kitchen Marital Status:   Intimate Partner Violence:   . Fear of Current or Ex-Partner:   .  Emotionally Abused:   Marland Kitchen Physically Abused:   . Sexually Abused:     Family History  Problem Relation Age of Onset  . Diabetes Mother        At Risk   . Cancer Mother        Skin   . Heart disease Father        Heart Attack   . CAD Father   . Diabetes Maternal Grandmother   . Hypertension Maternal Grandmother     ROS: no fevers or chills, productive cough, hemoptysis, dysphasia, odynophagia, melena, hematochezia, dysuria, hematuria, rash, seizure activity, orthopnea, PND, pedal edema, claudication. Remaining systems are negative.  Physical Exam: Well-developed obese in no acute distress.  Skin is warm and dry.  HEENT is normal.  Neck is supple.  Chest is clear to auscultation with normal expansion.  Cardiovascular exam  is regular rate and rhythm.  Abdominal exam nontender or distended. No masses palpated. Extremities show no edema. neuro grossly intact  ECG-normal sinus rhythm at a rate of 72, no ST changes.  Personally reviewed  A/P  1 question pulmonary hypertension- She was diagnosed with sleep apnea which could contribute. There may also be a component of obesity hypoventilation syndrome.  We again discussed the importance of weight loss.  We will repeat echocardiogram.  2 history of chest pain-no recurrent symptoms.  3 hyperlipidemia-continue statin.  Per primary care.  4 history of obstructive sleep apnea-I encouraged weight loss.  She states she was told she did not require CPAP in the past.  Kirk Ruths, MD

## 2020-03-20 ENCOUNTER — Ambulatory Visit: Payer: BC Managed Care – PPO | Admitting: Cardiology

## 2020-03-20 ENCOUNTER — Encounter: Payer: Self-pay | Admitting: Cardiology

## 2020-03-20 ENCOUNTER — Other Ambulatory Visit: Payer: Self-pay

## 2020-03-20 VITALS — BP 127/73 | HR 72 | Temp 98.0°F | Ht 67.0 in | Wt 249.2 lb

## 2020-03-20 DIAGNOSIS — E7800 Pure hypercholesterolemia, unspecified: Secondary | ICD-10-CM

## 2020-03-20 DIAGNOSIS — R0602 Shortness of breath: Secondary | ICD-10-CM | POA: Diagnosis not present

## 2020-03-20 DIAGNOSIS — I272 Pulmonary hypertension, unspecified: Secondary | ICD-10-CM

## 2020-03-20 NOTE — Patient Instructions (Signed)
Medication Instructions:  NO CHANGE *If you need a refill on your cardiac medications before your next appointment, please call your pharmacy*   Lab Work: If you have labs (blood work) drawn today and your tests are completely normal, you will receive your results only by: . MyChart Message (if you have MyChart) OR . A paper copy in the mail If you have any lab test that is abnormal or we need to change your treatment, we will call you to review the results.   Testing/Procedures: Your physician has requested that you have an echocardiogram. Echocardiography is a painless test that uses sound waves to create images of your heart. It provides your doctor with information about the size and shape of your heart and how well your heart's chambers and valves are working. This procedure takes approximately one hour. There are no restrictions for this procedure.[address]   Follow-Up: At [facility], you and your health needs are our priority.  As part of our continuing mission to provide you with exceptional heart care, we have created designated Provider Care Teams.  These Care Teams include your primary Cardiologist (physician) and Advanced Practice Providers (APPs -  Physician Assistants and Nurse Practitioners) who all work together to provide you with the care you need, when you need it.  We recommend signing up for the patient portal called "MyChart".  Sign up information is provided on this After Visit Summary.  MyChart is used to connect with patients for Virtual Visits (Telemedicine).  Patients are able to view lab/test results, encounter notes, upcoming appointments, etc.  Non-urgent messages can be sent to your provider as well.   To learn more about what you can do with MyChart, go to [email or web address].    Your next appointment:    AS NEEDED 

## 2020-04-09 ENCOUNTER — Other Ambulatory Visit: Payer: Self-pay

## 2020-04-09 ENCOUNTER — Ambulatory Visit (HOSPITAL_COMMUNITY): Payer: BC Managed Care – PPO | Attending: Cardiology

## 2020-04-09 DIAGNOSIS — R0602 Shortness of breath: Secondary | ICD-10-CM | POA: Insufficient documentation

## 2020-04-24 DIAGNOSIS — N202 Calculus of kidney with calculus of ureter: Secondary | ICD-10-CM | POA: Insufficient documentation

## 2020-04-24 DIAGNOSIS — M48061 Spinal stenosis, lumbar region without neurogenic claudication: Secondary | ICD-10-CM | POA: Insufficient documentation

## 2020-05-28 ENCOUNTER — Other Ambulatory Visit: Payer: Self-pay

## 2020-05-28 MED ORDER — ERGOCALCIFEROL 1.25 MG (50000 UT) PO CAPS: 50000.0000 [IU] | ORAL_CAPSULE | ORAL | 1 refills | Status: DC

## 2020-07-14 ENCOUNTER — Other Ambulatory Visit: Payer: Self-pay

## 2020-07-14 ENCOUNTER — Emergency Department (HOSPITAL_BASED_OUTPATIENT_CLINIC_OR_DEPARTMENT_OTHER)
Admission: EM | Admit: 2020-07-14 | Discharge: 2020-07-15 | Disposition: A | Discharge status: Home / Self Care | Payer: BC Managed Care – PPO | Attending: Emergency Medicine | Admitting: Emergency Medicine

## 2020-07-14 ENCOUNTER — Encounter (HOSPITAL_BASED_OUTPATIENT_CLINIC_OR_DEPARTMENT_OTHER): Payer: Self-pay | Admitting: Emergency Medicine

## 2020-07-14 ENCOUNTER — Emergency Department (HOSPITAL_BASED_OUTPATIENT_CLINIC_OR_DEPARTMENT_OTHER): Payer: BC Managed Care – PPO

## 2020-07-14 DIAGNOSIS — S93402A Sprain of unspecified ligament of left ankle, initial encounter: Secondary | ICD-10-CM

## 2020-07-14 DIAGNOSIS — Y9301 Activity, walking, marching and hiking: Secondary | ICD-10-CM | POA: Insufficient documentation

## 2020-07-14 DIAGNOSIS — Z79899 Other long term (current) drug therapy: Secondary | ICD-10-CM | POA: Diagnosis not present

## 2020-07-14 DIAGNOSIS — W101XXA Fall (on)(from) sidewalk curb, initial encounter: Secondary | ICD-10-CM | POA: Insufficient documentation

## 2020-07-14 DIAGNOSIS — Z87891 Personal history of nicotine dependence: Secondary | ICD-10-CM | POA: Insufficient documentation

## 2020-07-14 DIAGNOSIS — S99912A Unspecified injury of left ankle, initial encounter: Secondary | ICD-10-CM | POA: Diagnosis present

## 2020-07-14 MED ORDER — NAPROXEN 250 MG PO TABS
500.0000 mg | ORAL_TABLET | Freq: Once | ORAL | Status: AC
  Administered 2020-07-14: 500 mg via ORAL
  Filled 2020-07-14: qty 2

## 2020-07-14 MED ORDER — ACETAMINOPHEN 500 MG PO TABS
1000.0000 mg | ORAL_TABLET | Freq: Once | ORAL | Status: AC
  Administered 2020-07-14: 1000 mg via ORAL
  Filled 2020-07-14: qty 2

## 2020-07-14 NOTE — ED Triage Notes (Addendum)
Left ankle swelling/pain onset today after misstep on curb. Unable to bear weight

## 2020-07-15 ENCOUNTER — Encounter (HOSPITAL_BASED_OUTPATIENT_CLINIC_OR_DEPARTMENT_OTHER): Payer: Self-pay | Admitting: Emergency Medicine

## 2020-07-15 MED ORDER — IBUPROFEN 800 MG PO TABS: 800.0000 mg | ORAL_TABLET | Freq: Three times a day (TID) | ORAL | 0 refills | Status: DC

## 2020-07-15 NOTE — ED Provider Notes (Signed)
[facility] EMERGENCY DEPARTMENT Provider Note   CSN: [id number] Arrival date & time: 07/14/20  2212     History Chief Complaint  Patient presents with  . Ankle Pain    [name] is a 44 y.o. female.  The history is provided by the patient.  Ankle Pain Location:  Ankle Injury: yes   Mechanism of injury comment:  Twisted while walking Ankle location:  L ankle Pain details:    Quality:  Aching   Radiates to:  Does not radiate   Severity:  Moderate   Onset quality:  Sudden   Timing:  Constant   Progression:  Unchanged Chronicity:  New Dislocation: no   Foreign body present:  No foreign bodies Prior injury to area:  No Relieved by:  Nothing Worsened by:  Nothing Ineffective treatments:  None tried Associated symptoms: no back pain, no fever and no tingling   Risk factors: no concern for non-accidental trauma        Past Medical History:  Diagnosis Date  . Allergy   . Genital warts   . Hyperlipidemia   . Low back pain   . Nephrolithiasis   . Vitamin D deficiency     Patient Active Problem List   Diagnosis Date Noted  . 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 Mass of vulva 12/28/2018  . 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 Vaginal lesion 12/28/2018  . Obstructive sleep apnea 11/04/2017  . Obesity, unspecified 04/20/2014  . Lumbar disc disease 12/21/2011  . History of migraine headaches 12/21/2011  . Hyperlipidemia 07/24/2011  . Low back pain 07/24/2011  . Vitamin D deficiency 07/24/2011  . History of HPV infection 07/24/2011  . Allergic rhinitis 07/24/2011  . History of kidney stones 07/24/2011  . LEG PAIN, RIGHT 11/13/2008    Past Surgical History:  Procedure Laterality Date  . extraction of wisdom teeth  1999     OB History   No obstetric history on file.     Family History  Problem Relation Age of Onset  . Diabetes Mother        At Risk   . Cancer Mother        Skin   . Heart disease Father        Heart Attack   .  CAD Father   . Diabetes Maternal Grandmother   . Hypertension Maternal Grandmother     Social History   Tobacco Use  . Smoking status: Former Smoker    Types: Cigarettes    Quit date: 07/23/1998    Years since quitting: 21.9  . Smokeless tobacco: Never Used  Substance Use Topics  . Alcohol use: Yes    Comment: once a week  . Drug use: No    Home Medications Prior to Admission medications   Medication Sig Start Date End Date Taking? Authorizing Provider  cyclobenzaprine (FLEXERIL) 10 MG tablet Take 10 mg by mouth 3 (three) times daily as needed for muscle spasms.    [provider]  ergocalciferol (DRISDOL) 1.25 MG (50000 UT) capsule Take 1 capsule (50,000 Units total) by mouth once a week. 05/28/20   Elby Showers, MD  fexofenadine (ALLEGRA) 30 MG/5ML suspension Take 30 mg by mouth daily.    [provider]  folic acid (FOLVITE) 1 MG tablet TAKE 1 TABLET(1 MG) BY MOUTH DAILY 02/01/20   Elby Showers, MD  montelukast (SINGULAIR) 10 MG tablet Take 1 tablet by mouth daily. 04/04/18   [provider]  simvastatin (  ZOCOR) 20 MG tablet TAKE 1 TABLET(20 MG) BY MOUTH DAILY 02/13/20   Baxley, Cresenciano Lick, MD    Allergies    Biaxin [clarithromycin]  Review of Systems   Review of Systems  Constitutional: Negative for fever.  HENT: Negative for congestion.   Eyes: Negative for visual disturbance.  Respiratory: Negative for apnea.   Cardiovascular: Negative for chest pain.  Gastrointestinal: Negative for abdominal pain.  Genitourinary: Negative for difficulty urinating.  Musculoskeletal: Positive for arthralgias. Negative for back pain.  Skin: Negative for rash.  Neurological: Negative for dizziness.  Psychiatric/Behavioral: Negative for agitation.  All other systems reviewed and are negative.   Physical Exam Updated Vital Signs BP 130/73 (BP Location: Right Arm)   Pulse 82   Temp 98.4 F (36.9 C) (Oral)   Resp 19   LMP 07/06/2020   SpO2 97%   Physical  Exam Vitals and nursing note reviewed.  Constitutional:      General: She is not in acute distress.    Appearance: Normal appearance.  HENT:     Head: Normocephalic and atraumatic.     Nose: Nose normal.  Eyes:     Conjunctiva/sclera: Conjunctivae normal.     Pupils: Pupils are equal, round, and reactive to light.  Cardiovascular:     Rate and Rhythm: Normal rate and regular rhythm.     Pulses: Normal pulses.     Heart sounds: Normal heart sounds.  Pulmonary:     Effort: Pulmonary effort is normal.     Breath sounds: Normal breath sounds.  Abdominal:     General: Abdomen is flat. Bowel sounds are normal.     Palpations: Abdomen is soft.     Tenderness: There is no abdominal tenderness. There is no guarding.  Musculoskeletal:     Cervical back: Normal range of motion and neck supple.     Right ankle: Normal.     Left ankle: No deformity, ecchymosis or lacerations. No tenderness. Normal range of motion. Anterior drawer test negative. Normal pulse.     Left Achilles Tendon: Normal.     Left foot: Normal.  Skin:    General: Skin is warm and dry.     Capillary Refill: Capillary refill takes less than 2 seconds.  Neurological:     General: No focal deficit present.     Mental Status: She is alert and oriented to person, place, and time.     Deep Tendon Reflexes: Reflexes normal.  Psychiatric:        Mood and Affect: Mood normal.        Behavior: Behavior normal.     ED Results / Procedures / Treatments   Labs (all labs ordered are listed, but only abnormal results are displayed) Labs Reviewed - No data to display  EKG None  Radiology DG Ankle Complete Left  Result Date: 07/14/2020 CLINICAL DATA:  Fall EXAM: LEFT ANKLE COMPLETE - 3+ VIEW COMPARISON:  None. FINDINGS: There is no evidence of fracture, dislocation, or joint effusion. There is no evidence of arthropathy or other focal bone abnormality. Circumferential soft tissue swelling. IMPRESSION: Circumferential soft  tissue swelling without fracture or dislocation of the left ankle. Electronically Signed   By: Ulyses Jarred M.D.   On: 07/14/2020 23:24   DG Foot Complete Left  Result Date: 07/14/2020 CLINICAL DATA:  Fall EXAM: LEFT FOOT - COMPLETE 3+ VIEW COMPARISON:  None. FINDINGS: There is no evidence of fracture or dislocation. There is no evidence of arthropathy or other focal bone  abnormality. Soft tissue swelling at the ankle. IMPRESSION: No fracture or dislocation of the left foot. Soft tissue swelling at the ankle. Electronically Signed   By: Ulyses Jarred M.D.   On: 07/14/2020 23:25    Procedures Procedures (including critical care time)  Medications Ordered in ED Medications  acetaminophen (TYLENOL) tablet 1,000 mg (1,000 mg Oral Given 07/14/20 2344)  naproxen (NAPROSYN) tablet 500 mg (500 mg Oral Given 07/14/20 2344)    ED Course  I have reviewed the triage vital signs and the nursing notes.  Pertinent labs & imaging results that were available during my care of the patient were reviewed by me and considered in my medical decision making (see chart for details).    Ankle sprain.  Ice, elevation and NSAIDs, may alternate with tylenol   ALFRETTA PINCH was evaluated in Emergency Department on 07/15/2020 for the symptoms described in the history of present illness. She was evaluated in the context of the global COVID-19 pandemic, which necessitated consideration that the patient might be at risk for infection with the SARS-CoV-2 virus that causes COVID-19. Institutional protocols and algorithms that pertain to the evaluation of patients at risk for COVID-19 are in a state of rapid change based on information released by regulatory bodies including the CDC and federal and state organizations. These policies and algorithms were followed during the patient's care in the ED. Final Clinical Impression(s) / ED Diagnoses Return for intractable cough, coughing up blood,fevers >100.4 unrelieved by  medication, shortness of breath, intractable vomiting, chest pain, shortness of breath, weakness,numbness, changes in speech, facial asymmetry,abdominal pain, passing out,Inability to tolerate liquids or food, cough, altered mental status or any concerns. No signs of systemic illness or infection. The patient is nontoxic-appearing on exam and vital signs are within normal limits.   I have reviewed the triage vital signs and the nursing notes. Pertinent labs &imaging results that were available during my care of the patient were reviewed by me and considered in my medical decision making (see chart for details).After history, exam, and medical workup I feel the patient has beenappropriately medically screened and is safe for discharge home. Pertinent diagnoses were discussed with the patient. Patient was given return precautions.    Lenoir Facchini, MD 07/15/20 1165

## 2020-07-17 DIAGNOSIS — M25572 Pain in left ankle and joints of left foot: Secondary | ICD-10-CM | POA: Insufficient documentation

## 2020-08-02 DIAGNOSIS — M216X2 Other acquired deformities of left foot: Secondary | ICD-10-CM | POA: Insufficient documentation

## 2020-08-02 DIAGNOSIS — S93409A Sprain of unspecified ligament of unspecified ankle, initial encounter: Secondary | ICD-10-CM | POA: Insufficient documentation

## 2020-08-08 ENCOUNTER — Other Ambulatory Visit: Payer: Self-pay

## 2020-08-08 ENCOUNTER — Ambulatory Visit (INDEPENDENT_AMBULATORY_CARE_PROVIDER_SITE_OTHER): Payer: BC Managed Care – PPO | Admitting: Internal Medicine

## 2020-08-08 ENCOUNTER — Encounter: Payer: Self-pay | Admitting: Internal Medicine

## 2020-08-08 VITALS — BP 110/70 | HR 80 | Temp 98.2°F | Ht 68.0 in | Wt 250.0 lb

## 2020-08-08 DIAGNOSIS — Z23 Encounter for immunization: Secondary | ICD-10-CM | POA: Diagnosis not present

## 2020-08-08 NOTE — Patient Instructions (Signed)
Patient received a flu vaccine IM L deltoid, AV, CMA  

## 2020-08-11 NOTE — Progress Notes (Signed)
Flu vaccine given by CMA 

## 2020-08-24 ENCOUNTER — Other Ambulatory Visit (HOSPITAL_BASED_OUTPATIENT_CLINIC_OR_DEPARTMENT_OTHER): Payer: Self-pay | Admitting: Internal Medicine

## 2020-08-24 ENCOUNTER — Ambulatory Visit: Payer: BC Managed Care – PPO | Attending: Internal Medicine

## 2020-08-24 DIAGNOSIS — Z23 Encounter for immunization: Secondary | ICD-10-CM

## 2020-08-27 NOTE — Progress Notes (Signed)
   Covid-19 Vaccination Clinic  Name:  [name]    MRN: [id number] DOB: Aug 21, 1976  08/27/2020  [name] was observed post Covid-19 immunization for 15 minutes without incident. She was provided with Vaccine Information Sheet and instruction to access the V-Safe system.   [name] was instructed to call 911 with any severe reactions post vaccine: Marland Kitchen Difficulty breathing  . Swelling of face and throat  . A fast heartbeat  . A bad rash all over body  . Dizziness and weakness

## 2020-08-31 MED FILL — PFIZER-BIONTECH COVID-19 VA: 30 | 1 days supply | Qty: 0 | Fill #0

## 2020-09-28 ENCOUNTER — Telehealth: Payer: Self-pay | Admitting: Internal Medicine

## 2020-09-28 NOTE — Telephone Encounter (Signed)
Spot on back of leg, she said it is a growth under the skin and it has been there for about a year. She said it has been growing in the last month and she wants to get it checked and and she wasn't sure if she should come here or got to her dermatologist. What do you recommend?

## 2020-09-28 NOTE — Telephone Encounter (Signed)
Recommend Dermatologist

## 2020-09-28 NOTE — Telephone Encounter (Signed)
Pt aware.

## 2020-11-10 ENCOUNTER — Other Ambulatory Visit: Payer: Self-pay | Admitting: Internal Medicine

## 2020-12-25 ENCOUNTER — Other Ambulatory Visit: Payer: BC Managed Care – PPO | Admitting: Internal Medicine

## 2020-12-25 ENCOUNTER — Other Ambulatory Visit: Payer: Self-pay

## 2020-12-25 DIAGNOSIS — Z87442 Personal history of urinary calculi: Secondary | ICD-10-CM

## 2020-12-25 DIAGNOSIS — Z8739 Personal history of other diseases of the musculoskeletal system and connective tissue: Secondary | ICD-10-CM

## 2020-12-25 DIAGNOSIS — Z0000 Encounter for general adult medical examination without abnormal findings: Secondary | ICD-10-CM

## 2020-12-25 DIAGNOSIS — Z8669 Personal history of other diseases of the nervous system and sense organs: Secondary | ICD-10-CM

## 2020-12-25 DIAGNOSIS — Z6837 Body mass index (BMI) 37.0-37.9, adult: Secondary | ICD-10-CM

## 2020-12-25 DIAGNOSIS — M5416 Radiculopathy, lumbar region: Secondary | ICD-10-CM

## 2020-12-25 DIAGNOSIS — I8393 Asymptomatic varicose veins of bilateral lower extremities: Secondary | ICD-10-CM

## 2020-12-25 DIAGNOSIS — E7800 Pure hypercholesterolemia, unspecified: Secondary | ICD-10-CM

## 2020-12-25 DIAGNOSIS — Z8659 Personal history of other mental and behavioral disorders: Secondary | ICD-10-CM

## 2020-12-25 DIAGNOSIS — E785 Hyperlipidemia, unspecified: Secondary | ICD-10-CM

## 2020-12-25 DIAGNOSIS — E559 Vitamin D deficiency, unspecified: Secondary | ICD-10-CM

## 2020-12-25 DIAGNOSIS — Z8619 Personal history of other infectious and parasitic diseases: Secondary | ICD-10-CM

## 2020-12-25 DIAGNOSIS — J309 Allergic rhinitis, unspecified: Secondary | ICD-10-CM

## 2020-12-25 LAB — COMPLETE METABOLIC PANEL WITH GFR
AG Ratio: 1.6 (calc) (ref 1.0–2.5)
ALT: 19 U/L (ref 6–29)
AST: 14 U/L (ref 10–30)
Albumin: 4.1 g/dL (ref 3.6–5.1)
Alkaline phosphatase (APISO): 64 U/L (ref 31–125)
BUN: 13 mg/dL (ref 7–25)
CO2: 26 mmol/L (ref 20–32)
Calcium: 9.5 mg/dL (ref 8.6–10.2)
Chloride: 105 mmol/L (ref 98–110)
Creat: 0.84 mg/dL (ref 0.50–1.10)
GFR, Est African American: 99 mL/min/{1.73_m2} (ref >=60)
GFR, Est Non African American: 85 mL/min/{1.73_m2} (ref >=60)
Globulin: 2.5 g/dL (calc) (ref 1.9–3.7)
Glucose, Bld: 79 mg/dL (ref 65–99)
Potassium: 4.7 mmol/L (ref 3.5–5.3)
Sodium: 139 mmol/L (ref 135–146)
Total Bilirubin: 0.4 mg/dL (ref 0.2–1.2)
Total Protein: 6.6 g/dL (ref 6.1–8.1)

## 2020-12-25 LAB — CBC WITH DIFFERENTIAL/PLATELET
Absolute Monocytes: 429 cells/uL (ref 200–950)
Basophils Absolute: 72 cells/uL (ref 0–200)
Basophils Relative: 1.1 %
Eosinophils Absolute: 111 cells/uL (ref 15–500)
Eosinophils Relative: 1.7 %
HCT: 42.4 % (ref 35.0–45.0)
Hemoglobin: 14.2 g/dL (ref 11.7–15.5)
Lymphs Abs: 1684 cells/uL (ref 850–3900)
MCH: 28.9 pg (ref 27.0–33.0)
MCHC: 33.5 g/dL (ref 32.0–36.0)
MCV: 86.4 fL (ref 80.0–100.0)
MPV: 9.7 fL (ref 7.5–12.5)
Monocytes Relative: 6.6 %
Neutro Abs: 4206 cells/uL (ref 1500–7800)
Neutrophils Relative %: 64.7 %
Platelets: 370 10*3/uL (ref 140–400)
RBC: 4.91 10*6/uL (ref 3.80–5.10)
RDW: 13.1 % (ref 11.0–15.0)
Total Lymphocyte: 25.9 %
WBC: 6.5 10*3/uL (ref 3.8–10.8)

## 2020-12-25 LAB — LIPID PANEL
Cholesterol: 180 mg/dL (ref <200)
HDL: 62 mg/dL (ref >=50)
LDL Cholesterol (Calc): 97 mg/dL (calc)
Non-HDL Cholesterol (Calc): 118 mg/dL (calc) (ref <130)
Total CHOL/HDL Ratio: 2.9 (calc) (ref <5.0)
Triglycerides: 109 mg/dL (ref <150)

## 2020-12-25 LAB — VITAMIN D 25 HYDROXY (VIT D DEFICIENCY, FRACTURES): Vit D, 25-Hydroxy: 63 ng/mL (ref 30–100)

## 2020-12-25 LAB — TSH: TSH: 1.24 mIU/L

## 2020-12-31 ENCOUNTER — Encounter: Payer: Self-pay | Admitting: Internal Medicine

## 2020-12-31 ENCOUNTER — Other Ambulatory Visit: Payer: Self-pay

## 2020-12-31 ENCOUNTER — Ambulatory Visit (INDEPENDENT_AMBULATORY_CARE_PROVIDER_SITE_OTHER): Payer: BC Managed Care – PPO | Admitting: Internal Medicine

## 2020-12-31 VITALS — BP 102/80 | HR 102 | Ht 67.5 in | Wt 234.0 lb

## 2020-12-31 DIAGNOSIS — Z87442 Personal history of urinary calculi: Secondary | ICD-10-CM | POA: Diagnosis not present

## 2020-12-31 DIAGNOSIS — Z6836 Body mass index (BMI) 36.0-36.9, adult: Secondary | ICD-10-CM

## 2020-12-31 DIAGNOSIS — Z8639 Personal history of other endocrine, nutritional and metabolic disease: Secondary | ICD-10-CM

## 2020-12-31 DIAGNOSIS — Z8739 Personal history of other diseases of the musculoskeletal system and connective tissue: Secondary | ICD-10-CM | POA: Diagnosis not present

## 2020-12-31 DIAGNOSIS — Z0000 Encounter for general adult medical examination without abnormal findings: Secondary | ICD-10-CM | POA: Diagnosis not present

## 2020-12-31 DIAGNOSIS — E7800 Pure hypercholesterolemia, unspecified: Secondary | ICD-10-CM

## 2020-12-31 LAB — POCT URINALYSIS DIPSTICK
Appearance: NEGATIVE
Bilirubin, UA: NEGATIVE
Blood, UA: NEGATIVE
Glucose, UA: NEGATIVE
Ketones, UA: NEGATIVE
Leukocytes, UA: NEGATIVE
Nitrite, UA: NEGATIVE
Odor: NEGATIVE
Protein, UA: NEGATIVE
Spec Grav, UA: 1.015 (ref 1.010–1.025)
Urobilinogen, UA: 0.2 E.U./dL
pH, UA: 6.5 (ref 5.0–8.0)

## 2020-12-31 NOTE — Progress Notes (Signed)
   Subjective:    Patient ID: LAELA DEVINEY, female    DOB: [birth year], 45 y.o.   MRN: [id number]  HPI 45 year old Female for health maintenance exam and evaluation of medical issues.    History of sprain left ankle in September 2021.  X-rays were negative.  History of HPV and abnormal Pap smear followed by GYN.  Had sleep study April 2019 showing obstructive sleep apnea.  CPAP recommended but patient declined.  History of anxiety.  This is improved.  History of hyperlipidemia, vitamin D deficiency and obesity.  Please see details of GYN exam by Dr. Charlesetta Garibaldi in August 2021.  History of colposcopy 2008.  In 2019 tested negative for hepatitis B and hepatitis C.  In November 2019 she was seen by neurologist for spinal stenosis of the lumbar spine.  Was found to have spinal stenosis L4-L5 on MRI and conservative management was recommended.  She was treated with physical therapy.  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  Single, never married.  Does not smoke.  2-4 alcoholic drinks per week.  1 brother.  Parents living.  Mother is a retired Marine scientist.  Father is a retired Engineer, structural.  She has a steady female partner.  Family history: Both parents are overweight.  Mother with history of diabetes, hypertension and skin cancer.  Father with history of MI.  History of allergic rhinitis seen by Dr. Remus Blake.  Takes Singulair and Allegra.    Review of Systems no new complaints     Objective:   Physical Exam Blood pressure 102/80 pulse 102 pulse oximetry 97% weight 234 pounds.  BMI 36.11  Skin: Warm and dry.  No cervical adenopathy.  No thyromegaly.  Neck is supple.  Chest is clear.  Cardiac exam: Regular rate and rhythm normal S1 and S2 without murmurs or gallops.  Abdomen is soft nondistended without hepatosplenomegaly masses or tenderness.  GYN exam deferred to GYN physician.  No lower extremity pitting edema.  Affect felt judgment are  normal.       Assessment & Plan:  BMI 36.11-could benefit from Dr. Migdalia Dk clinic for weight loss.  She has lost 13 pounds since last year.  History of vitamin D deficiency-to take high-dose vitamin D weekly.  Vitamin D level a year ago was 22.  Now it is 52.  History of allergic rhinitis treated with Singulair  Family history of heart disease in father.  Family history of diabetes in mother.  History of HPV followed by GYN  History of kidney stones  Plan: Due to family history of heart disease have recommended statin medication.  Continue high-dose vitamin D.  Continue to work on diet exercise and weight loss.  Return in 1 year or as needed.

## 2021-01-03 LAB — HM MAMMOGRAPHY

## 2021-01-21 LAB — HM PAP SMEAR: HM Pap smear: NORMAL

## 2021-01-24 ENCOUNTER — Encounter: Payer: Self-pay | Admitting: Internal Medicine

## 2021-01-24 MED ORDER — ERGOCALCIFEROL 1.25 MG (50000 UT) PO CAPS: 50000.0000 [IU] | ORAL_CAPSULE | ORAL | 4 refills | Status: DC

## 2021-01-24 NOTE — Progress Notes (Incomplete)
   Subjective:    Patient ID: GENICE KIMBERLIN, female    DOB: 1976/09/01, 45 y.o.   MRN: [id number]  HPI 45 year old Female for health maintenance exam and evaluation of medical issues.    History of sprain left ankle in September 2021.  X-rays were negative.  History of HPV and abnormal Pap smear followed by GYN.  Had sleep study April 2019 showing obstructive sleep apnea.  CPAP recommended but patient declined.  History of anxiety.  This is improved.  History of hyperlipidemia, vitamin D deficiency and obesity.  Please see details of GYN exam by Dr. Charlesetta Garibaldi in August 2021.  History of colposcopy 2008.  In 2019 tested negative for hepatitis B and hepatitis C.  In November 2019 she was seen by neurologist for spinal stenosis of the lumbar spine.  Was found to have spinal stenosis L4-L5 on MRI and conservative management was recommended.  She was treated with physical therapy.  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  Single, never married.  Does not smoke.  2-4 alcoholic drinks per week.  1 brother.  Parents living.  Mother is a retired Marine scientist.  Father is a retired Engineer, structural.  She has a steady female partner.  Family history: Both parents are overweight.  Mother with history of diabetes, hypertension and skin cancer.  Father with history of MI.  History of allergic rhinitis seen by Dr. Remus Blake.  Takes Singulair and Allegra.    Review of Systems no new complaints     Objective:   Physical Exam Blood pressure 102/80 pulse 102 pulse oximetry 97% weight 234 pounds.  BMI 36.11  Skin: Warm and dry.  No cervical adenopathy.  No thyromegaly.  Neck is supple.  Chest is clear.  Cardiac exam: Regular rate and rhythm normal S1 and S2 without murmurs or gallops.  Abdomen is soft nondistended without hepatosplenomegaly masses or tenderness.  GYN exam deferred to GYN physician.  No lower extremity pitting edema.  Affect felt judgment are  normal.       Assessment & Plan:  BMI 36.11-could benefit from Dr. Migdalia Dk clinic for weight loss.  She has lost 13 pounds since last year.  History of vitamin D deficiency-to take high-dose vitamin D weekly.  Vitamin D level a year ago was 22.  Now it is 36.  History of allergic rhinitis treated with Singulair  Family history of heart disease in father.  Family history of diabetes in mother.  History of HPV followed by GYN  Plan: Due to family history of heart disease have recommended statin medication.

## 2021-01-25 NOTE — Patient Instructions (Signed)
It was a pleasure to see you today.  Continue high-dose vitamin D and statin medication.  Congratulations on 13 pound weight loss.  Keep up the good work.  Return in 1 year or as needed.

## 2021-02-04 ENCOUNTER — Other Ambulatory Visit: Payer: Self-pay | Admitting: Internal Medicine

## 2021-02-27 ENCOUNTER — Other Ambulatory Visit: Payer: Self-pay | Admitting: Internal Medicine

## 2021-03-01 ENCOUNTER — Encounter: Payer: Self-pay | Admitting: Internal Medicine

## 2021-03-01 ENCOUNTER — Ambulatory Visit (INDEPENDENT_AMBULATORY_CARE_PROVIDER_SITE_OTHER): Payer: BC Managed Care – PPO | Admitting: Internal Medicine

## 2021-03-01 ENCOUNTER — Telehealth: Payer: Self-pay

## 2021-03-01 ENCOUNTER — Other Ambulatory Visit: Payer: Self-pay

## 2021-03-01 VITALS — BP 110/80 | HR 100 | Temp 97.7°F | Ht 67.5 in | Wt 236.0 lb

## 2021-03-01 DIAGNOSIS — M79604 Pain in right leg: Secondary | ICD-10-CM | POA: Diagnosis not present

## 2021-03-01 MED ORDER — MELOXICAM 15 MG PO TABS: 15.0000 mg | ORAL_TABLET | Freq: Every day | ORAL | 0 refills | Status: DC

## 2021-03-01 NOTE — Telephone Encounter (Signed)
Scheduled

## 2021-03-01 NOTE — Telephone Encounter (Signed)
Patient woke up last night with a charley horse on her right calf, this morning it is still sore and it's at the area where she has spider veins and she is concerned that it could be a clot. She said there is redness around the spider veins and there is some warmth to it. No fever. She wants to know what to do?

## 2021-03-01 NOTE — Telephone Encounter (Signed)
Work in at 2:30 pm only have 15 minute appt

## 2021-03-25 NOTE — Patient Instructions (Signed)
May apply warm hot compresses to lower leg for comfort 15 to 20 minutes 2-3 times daily.  Take Mobic 15 mg daily for pain.  May take this for 3 to 5 days.  Call if symptoms worsen.  I do not believe this represents a deep venous thrombosis.

## 2021-03-25 NOTE — Progress Notes (Signed)
   Subjective:    Patient ID: [name], female    DOB: 09-20-1976, 45 y.o.   MRN: [id number]  HPI 45 year old Female seen today in person for right calf/leg pain.  She woke up last night with a cramp in her right calf.  This was concerning to her.  This morning the leg is still sore.  It is in an area where she has spider veins and she is concerned about a blood clot.  She has noticed some redness around the spider veins with some increased warmth.  Office visit was advised.  She has no history of clotting disorder.  She does not take oral contraceptives.  She does have history of hyperlipidemia.  Denies injury to the leg area.  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  Denies any recent accidents or trauma to the right leg    Review of Systems see above     Objective:   Physical Exam Blood pressure 110/80 pulse 100 regular temperature 97.7 pulse oximetry 97% weight 236 pounds BMI 36.42 patient is slightly anxious today.  Some mild erythema upper part of her lower leg below her knee medial aspect.  The leg is not tender or hot to touch.  No increased warmth.  No bruising.  Bevelyn Buckles' sign is negative.  No palpable cords in lower leg.       Assessment & Plan:  I think she had a muscle spasm in her leg last evening.  I do not think she has a clot at this time.  I have prescribed Mobic 15 mg daily which she may take for 3 to 5 days for musculoskeletal pain in her leg.  She will call if symptoms worsen.  Okay to apply warm compresses to her leg for comfort.

## 2021-05-08 ENCOUNTER — Telehealth: Payer: Self-pay | Admitting: Internal Medicine

## 2021-05-08 NOTE — Telephone Encounter (Signed)
[name] [phone]  [name] is having some Right ear pressure for last couple of weeks, she is going to dentist this afternoon to make sure she does not have an infection or that it is coming from teeth. If it is not that she would like to see you tomorrow, she stated a couple weeks ago she pushed a Q Tip in to far and it hurt for a little while, not sure if she done something then. She is l;leaving Friday to go out of town. Will call back in the morning to let us know what dentist said.

## 2021-05-09 ENCOUNTER — Ambulatory Visit: Payer: BC Managed Care – PPO | Admitting: Internal Medicine

## 2021-05-09 ENCOUNTER — Encounter: Payer: Self-pay | Admitting: Internal Medicine

## 2021-05-09 ENCOUNTER — Other Ambulatory Visit: Payer: Self-pay

## 2021-05-09 VITALS — BP 110/80 | HR 91 | Temp 98.2°F | Ht 67.5 in | Wt 245.0 lb

## 2021-05-09 DIAGNOSIS — H9201 Otalgia, right ear: Secondary | ICD-10-CM

## 2021-05-09 MED ORDER — AZITHROMYCIN 250 MG PO TABS: ORAL_TABLET | ORAL | 0 refills | Status: AC

## 2021-05-09 MED ORDER — METHYLPREDNISOLONE ACETATE 80 MG/ML IJ SUSP
80.0000 mg | Freq: Once | INTRAMUSCULAR | Status: AC
  Administered 2021-05-09: 80 mg via INTRAMUSCULAR

## 2021-05-09 MED ORDER — FLUCONAZOLE 150 MG PO TABS: 150.0000 mg | ORAL_TABLET | Freq: Once | ORAL | 0 refills | Status: AC

## 2021-05-09 NOTE — Patient Instructions (Signed)
Depo-Medrol 80 mg IM.  Take Zithromax Z-PAK 2 tabs day 1 followed by 1 tab days 2 through 5.  May take meloxicam while on your trip for ear/jaw pain if needed.  May take Diflucan if needed for Candida vaginitis.

## 2021-05-09 NOTE — Progress Notes (Signed)
   Subjective:    Patient ID: [name], female    DOB: 1976-08-27, 45 y.o.   MRN: [id number]  HPI  45 year old Female seen today acutely for  right jaw /ear pain. Has seen dentist yesterday regarding this.  She called yesterday saying she had had some right ear pressure for couple of weeks.  She went to the dentist today who checked her teeth and did not find any evidence of abscess.  A couple of weeks ago she pushed a Q-tip in her right ear canal too far and it hurt a bit.  She is leaving to go to [place] tomorrow for a seminar.  She has no fever or shaking chills.  No nausea vomiting or headache.  She has a history of anxiety, hyperlipidemia, obesity and vitamin D deficiency.  Review of Systems no nausea, vomiting or diarrhea.  No headache     Objective:   Physical Exam Blood pressure 110/80 pulse 91 pulse oximetry 98% temperature 98.2 degrees weight 245 pounds BMI 37.81 Right TM is full but not red.  Left TM is normal.  Pharynx is clear.  Neck is supple without thyromegaly.  No adenopathy in the cervical area.  Chest is clear.      Assessment & Plan:  Right otalgia   Right serous otitis media  Plan: Depo-Medrol 80 mg IM which should help with serous otitis media.  Take Zithromax Z-PAK 2 tabs day 1 followed by 1 tab days 2 through 5.  May take Diflucan 150 mg tablet should she develop Candida vaginitis while on antibiotics.  She does have some meloxicam which was given to her at a previous visit for musculoskeletal pain which she could take for discomfort in the ear/jaw area.

## 2021-05-09 NOTE — Telephone Encounter (Signed)
Patient is calling back regarding message from yesterday.

## 2021-05-09 NOTE — Telephone Encounter (Addendum)
Dentist did not find any infection said it could be TMJ related and said for her to take Ibuprofen. Dentist did x-rays and they were fine. She would like for you to look in ear and make sure it is okay. Scheduled for this afternoon

## 2021-05-31 ENCOUNTER — Encounter: Payer: Self-pay | Admitting: Internal Medicine

## 2021-05-31 ENCOUNTER — Ambulatory Visit: Payer: BC Managed Care – PPO | Admitting: Internal Medicine

## 2021-05-31 ENCOUNTER — Other Ambulatory Visit: Payer: Self-pay

## 2021-05-31 VITALS — BP 120/80 | HR 86 | Temp 98.1°F | Ht 67.5 in | Wt 244.0 lb

## 2021-05-31 DIAGNOSIS — R609 Edema, unspecified: Secondary | ICD-10-CM | POA: Diagnosis not present

## 2021-05-31 MED ORDER — ROPINIROLE HCL 0.25 MG PO TABS: 0.2500 mg | ORAL_TABLET | Freq: Every day | ORAL | 1 refills | Status: DC

## 2021-05-31 MED ORDER — FUROSEMIDE 40 MG PO TABS: ORAL_TABLET | ORAL | 3 refills | Status: DC

## 2021-05-31 NOTE — Progress Notes (Signed)
Subjective:    Patient ID: [name], female    DOB: 05-Mar-1976, 45 y.o.   MRN: [id number]  HPI 45 year old Female her for a couple of reasons. Is having some restless leg symptoms at night when in bed. This is a new complaint.  She is wondering if it has anything to do with her superficial varicosities.  I do not think these 2 issues are related.  Patient is concerned there is an issue with her circulation.  She has issues with dependent edema when traveling.  Does not have any upcoming travel in the near future.  Seems to be exacerbated by hot weather.  Has found some compression socks on [employer] that have helped.  Edema.  Was able to travel for several hours without swelling wearing the socks.  She still has had questions and concerns about her legs and would like to be evaluated by vascular surgeon.  She does not smoke.  Social alcohol consumption.  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  She was seen in June 2019 at [facility] regarding bilateral lower extremity aching and throbbing at night for: Long travel by car or airplane.  Was given prescription for support stockings and was advised to return in 3 months after which intervention would be considered.  She had duplex ultrasound at that time and report states  patient had   " left great saphenous vein dilated 10.2 mm in the proximal thigh refluxing to greater than 500 mm in duration from saphenofemoral junction to the ankle, connected to dilated refluxing tributaries.  Right great saphenous vein measures 9.5 mm in the proximal thigh refluxing greater than 500 ms in duration from the saphenofemoral junction to the proximal calf ,connected to dilated in her saphenous tributaries to the small saphenous with reflux greater than 500 ms in duration.  Deep system veins are compressible without reflux or DVT.  No aneurysm or tortuosity.  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  History of obstructive sleep apnea.  CPAP recommended but patient declined.  She is on statin medication.     Objective:   Physical Exam Vital signs reviewed.   She has a fairly large superficial vein right lower extremity that is palpable.  It is not red or tender.  Has trace pitting lower extremity edema.  Has some scattered superficial varicosities both legs that are nontender.      Assessment & Plan:  Obesity-weight loss management has been discussed previously  Hyperlipidemia-treated with statin  Superficial varicosities of legs  History of vitamin D deficiency currently treated with high-dose vitamin D weekly  History of allergic rhinitis treated with Singulair and Allegra  Dependent edema-prescription for Lasix 40 mg daily to take when traveling for dependent edema as  needed   She has a fairly large superficial vein right lower extremity that is palpable.  Not red or significantly tender.  She has trace pitting lower extremity edema.  She has some scattered superficial varicosities that are nontender both legs.  Probable restless leg syndrome-starting Requip at low-dose at bedtime  Plan: Patient would like Vascular Surgery consultation.  Requesting consultation with [facility].  Begin Requip 0.25 mg at bedtime for restless leg syndrome.

## 2021-05-31 NOTE — Patient Instructions (Addendum)
Try Requip 0.25 mg at bedtime for restless leg symptoms.  When traveling, take Lasix 40 mg before traveling.  The medication will last 6 hours.  Referral to vascular surgery for evaluation of superficial varicosities.  Continue statin medication as prescribed.  Patient should consider referral to [facility] clinic for weight loss management.

## 2021-08-22 ENCOUNTER — Ambulatory Visit (INDEPENDENT_AMBULATORY_CARE_PROVIDER_SITE_OTHER): Payer: BC Managed Care – PPO

## 2021-08-22 ENCOUNTER — Other Ambulatory Visit: Payer: Self-pay

## 2021-08-22 ENCOUNTER — Encounter: Payer: Self-pay | Admitting: Internal Medicine

## 2021-08-22 DIAGNOSIS — Z23 Encounter for immunization: Secondary | ICD-10-CM | POA: Diagnosis not present

## 2021-08-27 HISTORY — PX: BASAL CELL CARCINOMA EXCISION: SHX1214

## 2021-12-30 ENCOUNTER — Other Ambulatory Visit: Payer: Self-pay

## 2021-12-30 ENCOUNTER — Other Ambulatory Visit: Payer: BC Managed Care – PPO | Admitting: Internal Medicine

## 2021-12-30 ENCOUNTER — Encounter: Payer: Self-pay | Admitting: Internal Medicine

## 2021-12-30 DIAGNOSIS — E7800 Pure hypercholesterolemia, unspecified: Secondary | ICD-10-CM

## 2021-12-30 DIAGNOSIS — E559 Vitamin D deficiency, unspecified: Secondary | ICD-10-CM

## 2021-12-30 DIAGNOSIS — R5383 Other fatigue: Secondary | ICD-10-CM

## 2021-12-30 DIAGNOSIS — Z0000 Encounter for general adult medical examination without abnormal findings: Secondary | ICD-10-CM

## 2021-12-31 LAB — LIPID PANEL
Cholesterol: 144 mg/dL (ref <200)
HDL: 45 mg/dL — ABNORMAL LOW (ref >=50)
LDL Cholesterol (Calc): 83 mg/dL (calc)
Non-HDL Cholesterol (Calc): 99 mg/dL (calc) (ref <130)
Total CHOL/HDL Ratio: 3.2 (calc) (ref <5.0)
Triglycerides: 76 mg/dL (ref <150)

## 2021-12-31 LAB — CBC WITH DIFFERENTIAL/PLATELET
Absolute Monocytes: 340 cells/uL (ref 200–950)
Basophils Absolute: 60 cells/uL (ref 0–200)
Basophils Relative: 1.2 %
Eosinophils Absolute: 120 cells/uL (ref 15–500)
Eosinophils Relative: 2.4 %
HCT: 44.8 % (ref 35.0–45.0)
Hemoglobin: 14.5 g/dL (ref 11.7–15.5)
Lymphs Abs: 1290 cells/uL (ref 850–3900)
MCH: 28.4 pg (ref 27.0–33.0)
MCHC: 32.4 g/dL (ref 32.0–36.0)
MCV: 87.7 fL (ref 80.0–100.0)
MPV: 10.5 fL (ref 7.5–12.5)
Monocytes Relative: 6.8 %
Neutro Abs: 3190 cells/uL (ref 1500–7800)
Neutrophils Relative %: 63.8 %
Platelets: 353 10*3/uL (ref 140–400)
RBC: 5.11 10*6/uL — ABNORMAL HIGH (ref 3.80–5.10)
RDW: 13.4 % (ref 11.0–15.0)
Total Lymphocyte: 25.8 %
WBC: 5 10*3/uL (ref 3.8–10.8)

## 2021-12-31 LAB — VITAMIN D 25 HYDROXY (VIT D DEFICIENCY, FRACTURES): Vit D, 25-Hydroxy: 90 ng/mL (ref 30–100)

## 2021-12-31 LAB — TSH: TSH: 1.08 mIU/L

## 2022-01-02 ENCOUNTER — Encounter: Payer: Self-pay | Admitting: Internal Medicine

## 2022-01-02 ENCOUNTER — Ambulatory Visit (INDEPENDENT_AMBULATORY_CARE_PROVIDER_SITE_OTHER): Payer: BC Managed Care – PPO | Admitting: Internal Medicine

## 2022-01-02 ENCOUNTER — Other Ambulatory Visit: Payer: Self-pay

## 2022-01-02 VITALS — BP 104/74 | HR 75 | Temp 97.9°F | Ht 67.5 in | Wt 229.8 lb

## 2022-01-02 DIAGNOSIS — Z8639 Personal history of other endocrine, nutritional and metabolic disease: Secondary | ICD-10-CM

## 2022-01-02 DIAGNOSIS — Z6835 Body mass index (BMI) 35.0-35.9, adult: Secondary | ICD-10-CM | POA: Diagnosis not present

## 2022-01-02 DIAGNOSIS — Z0000 Encounter for general adult medical examination without abnormal findings: Secondary | ICD-10-CM

## 2022-01-02 DIAGNOSIS — Z8249 Family history of ischemic heart disease and other diseases of the circulatory system: Secondary | ICD-10-CM

## 2022-01-02 NOTE — Progress Notes (Signed)
° °  Subjective:    Patient ID: [name], female    DOB: [birth year], 46 y.o.   MRN: [id number]  HPI  46 year old Female seen today for health maintenance exam and evaluation of medical issues.  History of HPV and abnormal Pap smear followed by GYN.  History of sleep study 2019 showing obstructive sleep apnea.  CPAP was recommended but patient declined.  History of hyperlipidemia, vitamin D deficiency and obesity.  History of anxiety but that has improved.  History of colposcopy in 2008.  In 2019 tested negative for hepatitis B and hepatitis C.  In November 2019 seen by neurologist for spinal stenosis of the lumbar spine.  Was found to have spinal stenosis at L4-L5 on MRI and conservative management was recommended.  She was treated with physical therapy and improved.  History of allergic rhinitis seen by Dr. Gary Fleet.  Takes Singulair and Allegra.  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  She has a college degree.  Single, never married.  Does not smoke.  2-4 alcoholic drinks per week.  1 brother.  Parents are living.  Mother is retired Engineer, civil (consulting).  Father is a retired Emergency planning/management officer and has some dementia.  Until recently, had a steady female partner.  She plans to return to the [employer] school system next year.  History of left ankle sprain in September 2021.  X-rays were negative for fracture.      Review of Systems See above-talked about diet and exercise but commuting has added extra time to her work today.    Objective:   Physical Exam Blood pressure excellent 104/74 pulse 75 temperature 97.9 degrees pulse oximetry 97% weight 229 pounds BMI 35.45.  She has lost 5 pounds since exam of March 2022.  Skin: Warm and dry.  No cervical adenopathy.  No thyromegaly.  No carotid bruits.  Chest is clear to auscultation.  Cardiac exam: Regular rate and rhythm without ectopy, murmurs or gallops.  Abdomen:  Is soft nondistended without hepatosplenomegaly, masses, or tenderness.  GYN exam, deferred to gynecologist.  No lower extremity pitting edema.  Affect, thought, and judgment are normal.       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  Return in 1 year or as needed.

## 2022-01-24 NOTE — Patient Instructions (Signed)
It was, as always, pleasure to see you today.  Continue current medications and follow-up in 1 year.  Continue to work on diet exercise and weight loss. ?

## 2022-01-27 ENCOUNTER — Ambulatory Visit (HOSPITAL_BASED_OUTPATIENT_CLINIC_OR_DEPARTMENT_OTHER)
Admission: RE | Admit: 2022-01-27 | Discharge: 2022-01-27 | Disposition: A | Discharge status: Home / Self Care | Payer: Self-pay | Source: Ambulatory Visit | Attending: Internal Medicine | Admitting: Internal Medicine

## 2022-01-27 DIAGNOSIS — Z8249 Family history of ischemic heart disease and other diseases of the circulatory system: Secondary | ICD-10-CM | POA: Insufficient documentation

## 2022-03-17 ENCOUNTER — Other Ambulatory Visit: Payer: Self-pay | Admitting: Internal Medicine

## 2022-03-26 ENCOUNTER — Telehealth: Payer: Self-pay | Admitting: Internal Medicine

## 2022-03-26 NOTE — Telephone Encounter (Signed)
[name] [phone]  Brunilda called to say she is having right leg cramps in the calf area for last several months, sometimes at night, sometimes during day and it is in the area where she has spider veins. Scheduled appointment for Friday afternoon

## 2022-03-27 NOTE — Telephone Encounter (Signed)
Scheduled

## 2022-03-28 ENCOUNTER — Other Ambulatory Visit: Payer: Self-pay | Admitting: Internal Medicine

## 2022-03-28 ENCOUNTER — Encounter: Payer: Self-pay | Admitting: Internal Medicine

## 2022-03-28 ENCOUNTER — Ambulatory Visit: Payer: BC Managed Care – PPO | Admitting: Internal Medicine

## 2022-03-28 VITALS — BP 110/76 | HR 91 | Temp 98.5°F | Ht 67.5 in | Wt 205.5 lb

## 2022-03-28 DIAGNOSIS — M79604 Pain in right leg: Secondary | ICD-10-CM | POA: Diagnosis not present

## 2022-03-28 DIAGNOSIS — R609 Edema, unspecified: Secondary | ICD-10-CM | POA: Diagnosis not present

## 2022-03-28 DIAGNOSIS — Z6831 Body mass index (BMI) 31.0-31.9, adult: Secondary | ICD-10-CM | POA: Diagnosis not present

## 2022-03-28 DIAGNOSIS — I8390 Asymptomatic varicose veins of unspecified lower extremity: Secondary | ICD-10-CM

## 2022-03-28 DIAGNOSIS — Z8249 Family history of ischemic heart disease and other diseases of the circulatory system: Secondary | ICD-10-CM

## 2022-03-28 MED ORDER — ROPINIROLE HCL ER 4 MG PO TB24: 4.0000 mg | ORAL_TABLET | Freq: Every day | ORAL | 1 refills | Status: DC

## 2022-03-28 MED ORDER — FUROSEMIDE 40 MG PO TABS: 40.0000 mg | ORAL_TABLET | Freq: Every day | ORAL | 3 refills | Status: DC

## 2022-03-28 NOTE — Progress Notes (Signed)
   Subjective:    Patient ID: [name], female    DOB: Nov 08, 1975, 46 y.o.   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  School will be out as of late next week.  She is on her feet a great deal.  Has noted some discomfort in her right medial leg.  She is planning a trip to [place] and is concerned she might have an occult DVT.  She would like this checked out before leaving for [place].  She has no history of DVT.  She is also concerned about the possibility of developing dependent edema on long plane ride to [place].  We have arranged for her to have Doppler of the right lower extremity next week.  She has been prescribed furosemide 40 mg daily if she develops dependent edema while on her trip to [place].  Says that she finds that her right leg is uncomfortable sometimes at night in the bed.  It sounds like there could be an element of restless leg syndrome we have prescribed Requip 4 mg at bedtime to see if this will help.  This may be helpful on her trip to [place].  She is on statin therapy but I do not think this medication is causing any of her symptoms.  There is a family history of heart disease.  She has a low HDL of 45 and a year ago it was 62.  However her lipids were checked in March and were within normal limits except for her HDL.  She has been successful at recent weight loss efforts.  BMI has decreased from 36-31.71.  Review of Systems see above-no redness of her right leg but has some faint superficial varicosities that are visible.     Objective:   Physical Exam  Blood pressure 110/76 pulse 91 temperature 98.5 degrees pulse oximetry 97% weight 205 pounds 8 ounces height 5 feet 7.5 inches BMI 31.71  The right medial leg has no erythema.  There are no thickened cords.  There are some superficial varicosities present.      Assessment & Plan:  Dependent  edema-has occurred on previous trips  Superficial varicosities right lower extremity  Hyperlipidemia-she is on statin therapy and recent coronary calcium score is 0 which is excellent  Congratulated on weight loss.  BMI in March was 35.45 and is now 31.71  Probable restless leg syndrome.  She should try ropinirole 4 mg XL to see if she gets relief from right leg discomfort.  Plan: Due to the length of her trip to [place], it would be prudent to rule out current DVT and she will have Doppler study in the right leg in the near future.  Have prescribed ropinirole 4 mg XL to take at bedtime for possible restless leg syndrome.  She will continue with statin therapy and vitamin D supplementation.  She has been given Lasix 40 mg daily to take if she develops dependent edema on this trip.

## 2022-03-28 NOTE — Patient Instructions (Addendum)
Try to keep feet elevated if possible to prevent dependent edema.  Have given patient prescription for Lasix 40 mg daily if develops dependent edema on long trip to [place].  Have also prescribed ropinirole 4 mg XL to take at bedtime for possible restless leg syndrome.  Have ordered lower extremity Doppler study to be done next week before she leaves for [place].

## 2022-04-03 ENCOUNTER — Ambulatory Visit (HOSPITAL_COMMUNITY)
Admission: RE | Admit: 2022-04-03 | Discharge: 2022-04-03 | Disposition: A | Discharge status: Home / Self Care | Payer: BC Managed Care – PPO | Source: Ambulatory Visit | Attending: Internal Medicine | Admitting: Internal Medicine

## 2022-04-03 DIAGNOSIS — M79604 Pain in right leg: Secondary | ICD-10-CM | POA: Diagnosis not present

## 2022-04-03 NOTE — Progress Notes (Signed)
Lower extremity venous right study completed.   Please see CV Proc for preliminary results.   Jahayra Mazo, RDMS, RVT  

## 2022-07-09 DIAGNOSIS — M79642 Pain in left hand: Secondary | ICD-10-CM | POA: Insufficient documentation

## 2022-07-09 DIAGNOSIS — G5620 Lesion of ulnar nerve, unspecified upper limb: Secondary | ICD-10-CM | POA: Insufficient documentation

## 2022-08-15 ENCOUNTER — Ambulatory Visit (INDEPENDENT_AMBULATORY_CARE_PROVIDER_SITE_OTHER): Payer: BC Managed Care – PPO

## 2022-08-15 VITALS — BP 106/70 | Temp 98.3°F

## 2022-08-15 DIAGNOSIS — Z23 Encounter for immunization: Secondary | ICD-10-CM | POA: Diagnosis not present

## 2022-08-25 ENCOUNTER — Ambulatory Visit: Payer: BC Managed Care – PPO | Admitting: Internal Medicine

## 2022-08-25 ENCOUNTER — Encounter: Payer: Self-pay | Admitting: Internal Medicine

## 2022-08-25 VITALS — BP 110/70 | HR 81 | Temp 98.8°F | Ht 67.5 in | Wt 189.4 lb

## 2022-08-25 DIAGNOSIS — R1033 Periumbilical pain: Secondary | ICD-10-CM

## 2022-08-25 DIAGNOSIS — Z202 Contact with and (suspected) exposure to infections with a predominantly sexual mode of transmission: Secondary | ICD-10-CM

## 2022-08-25 NOTE — Patient Instructions (Signed)
Patient will come in Friday for STD testing.  I believe she has mild periabdominal pain due to musculoskeletal strain.

## 2022-08-25 NOTE — Progress Notes (Signed)
   Subjective:    Patient ID: [name], female    DOB: 04-12-1976, 46 y.o.   MRN: [id number]  HPI  Patient is an Automotive engineer. 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 Felt a sharp uncomfortable pain in periumbilical area. It has persisted intermittently especially when getting out of her of her chair. The furniture is arranged in such a way that she has to slide out of her chair at an angle. She has had no nausea, vomiting, fever, chills, urinary symptoms, back pain  Also, she wants to be tested for STDs in the near future.  Her partner recently moved out of state and she would like to be tested.  She will come in this coming Friday.  Review of Systems see above     Objective:   Physical Exam  Vital signs reviewed  Abdomen: Soft nondistended without hepatosplenomegaly masses or significant tenderness.  Slight discomfort in her periumbilical area to mild palpation but no rebound tenderness.      Assessment & Plan:  My feeling it is that this is a strain of the abdominal musculature  Plan: She was reassured and may take Tylenol or Advil for pain if necessary.  Should resolve in a few days.  She can come in Friday for STD testing.

## 2022-08-26 ENCOUNTER — Encounter: Payer: Self-pay | Admitting: Internal Medicine

## 2022-08-29 ENCOUNTER — Other Ambulatory Visit: Payer: BC Managed Care – PPO

## 2022-08-29 DIAGNOSIS — Z113 Encounter for screening for infections with a predominantly sexual mode of transmission: Secondary | ICD-10-CM

## 2022-08-30 LAB — C. TRACHOMATIS/N. GONORRHOEAE RNA
C. trachomatis RNA, TMA: NOT DETECTED
N. gonorrhoeae RNA, TMA: NOT DETECTED

## 2022-09-02 IMAGING — CT CT CARDIAC CORONARY ARTERY CALCIUM SCORE
3 series · 14 of 20 positions shown, 16 images · non-contrast
Comparison: 04/15/2017.
COMPARISON: 04/15/2017.

Addendum:
EXAM:
OVER-READ INTERPRETATION  CT CHEST

The following report is an over-read performed by radiologist Dr.
Niacik Lucianus [REDACTED] on 01/27/2022. This
over-read does not include interpretation of cardiac or coronary
anatomy or pathology. The coronary calcium score/coronary CTA
interpretation by the cardiologist is attached.
CLINICAL DATA: 45F for cardiovascular disease risk stratification
Coronary Calcium Score
TECHNIQUE: A gated, non-contrast computed tomography scan of the heart was
performed using 3mm slice thickness. Axial images were analyzed on a
dedicated workstation. Calcium scoring of the coronary arteries was
performed using the Agatston method.

[Series 2: ax lung · axial · 0.83mm/px · z∈[+1140,+1230]mm · 5 of 69 slices shown]
[im 12/69  lung]
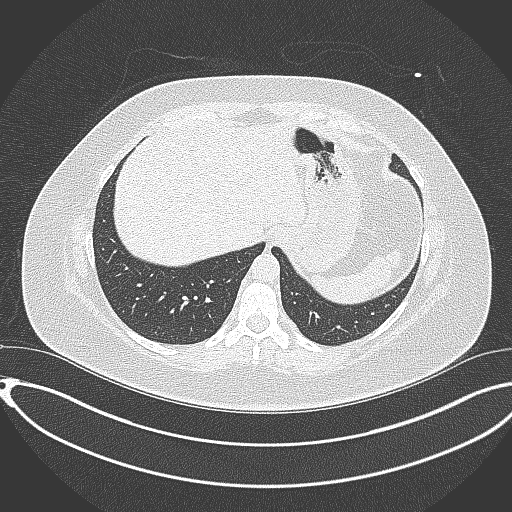
[im 23/69  lung]
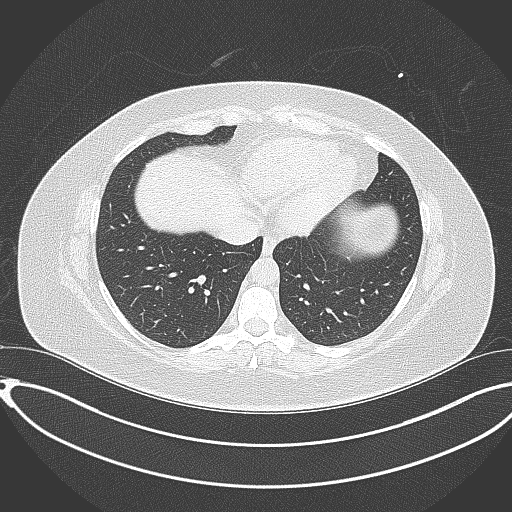
[im 35/69  lung]
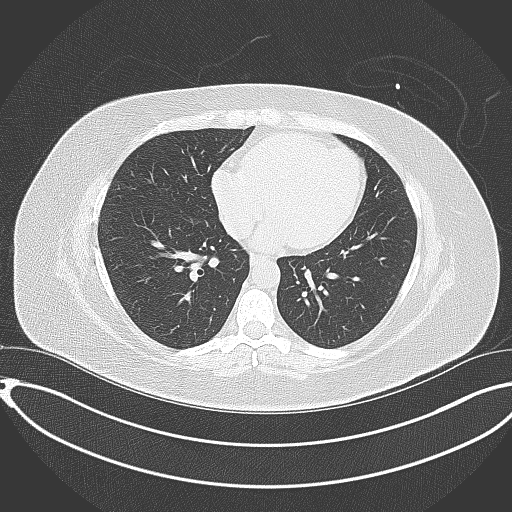
[im 46/69  lung]
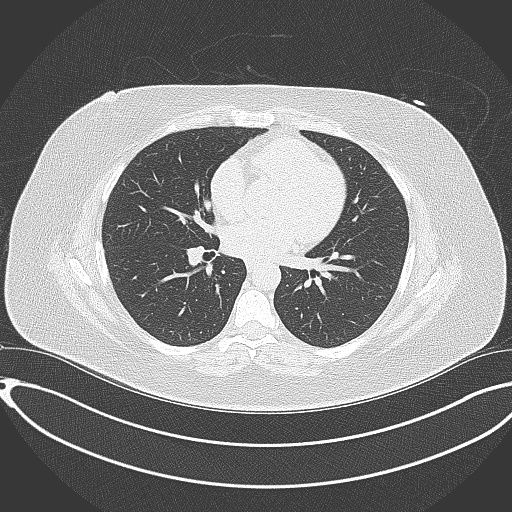
[im 57/69  lung]
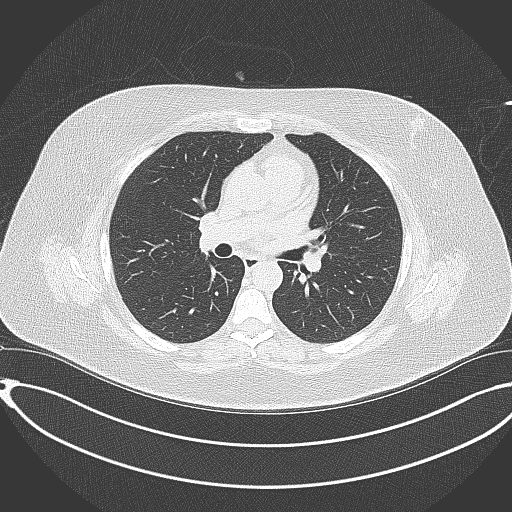

[Series 3: cascseq 3.0 sa36 70% (id) · axial · 0.39mm/px · z∈[+1151,+1217]mm · 3 of 46 slices shown]
[im 12/46  vessel]
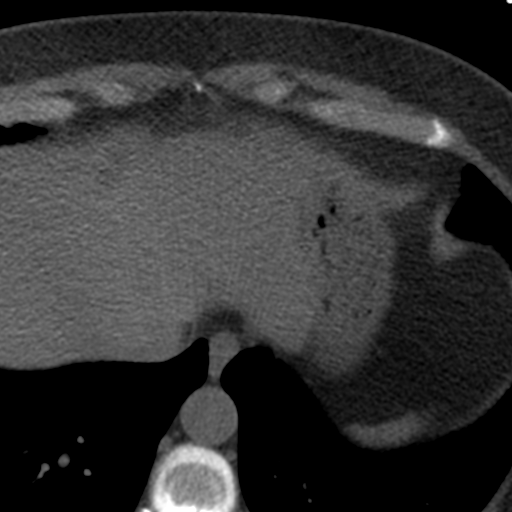
[im 23/46  vessel]
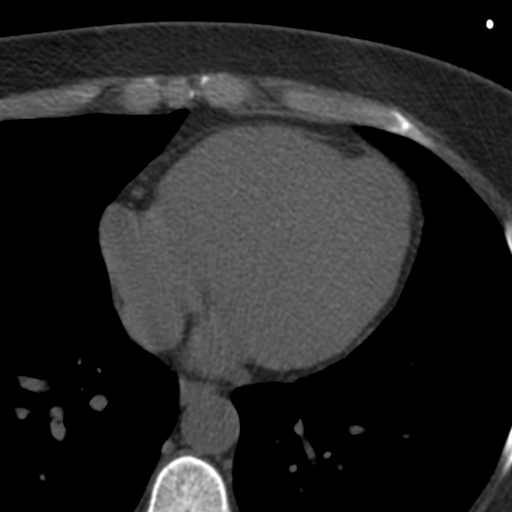
[im 34/46  vessel]
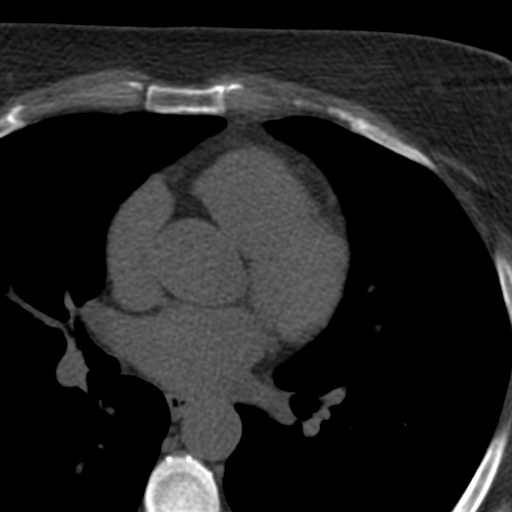

[Series 4: ax st · axial · 0.71mm/px · z∈[+1136,+1234]mm · 6 of 69 slices shown, 8 images]
[im 10/69  vessel]
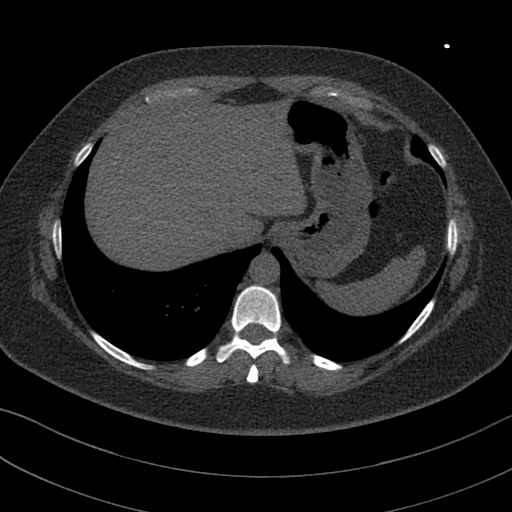
[im 10/69  lung]
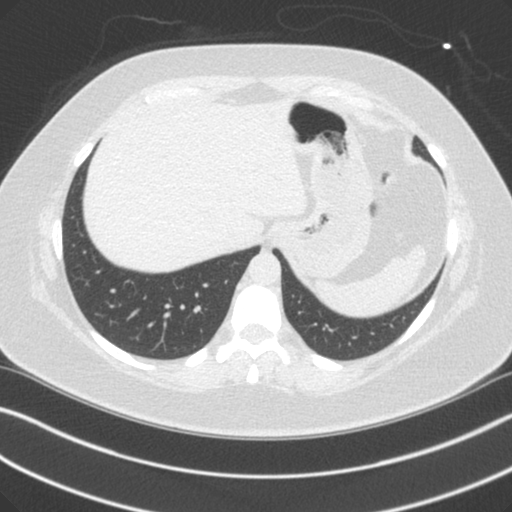
[im 20/69  vessel]
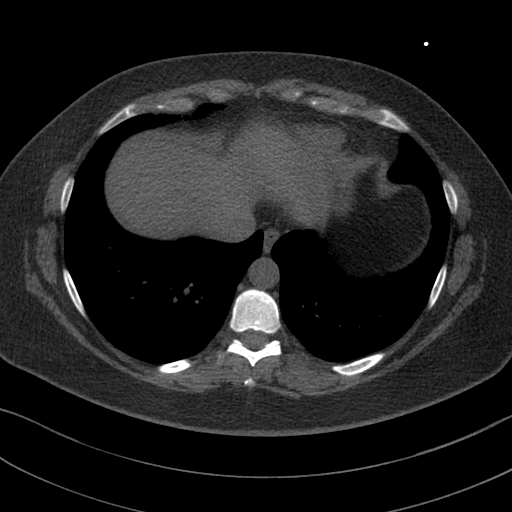
[im 30/69  vessel]
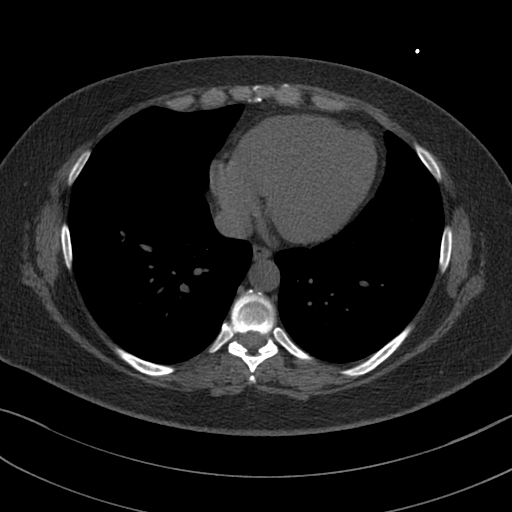
[im 39/69  vessel]
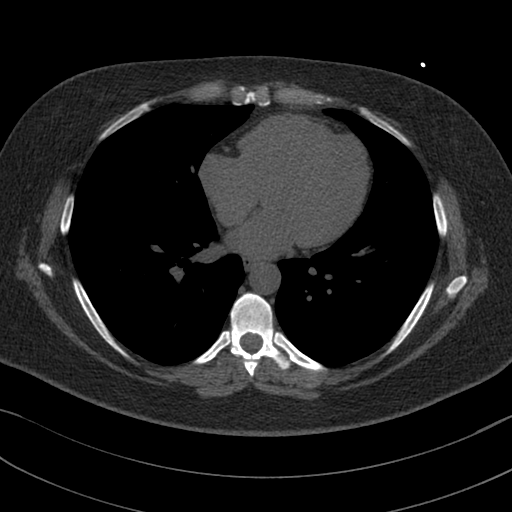
[im 49/69  vessel]
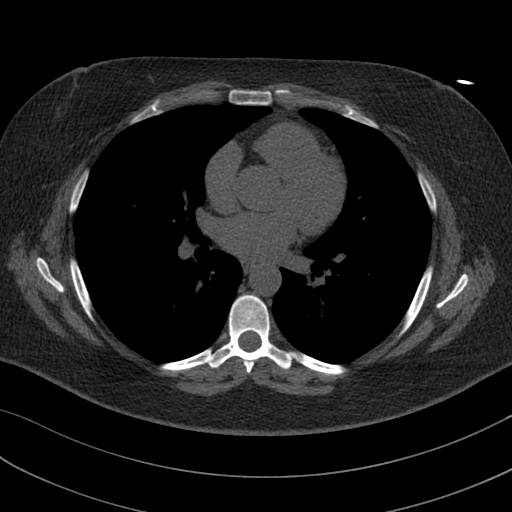
[im 49/69  lung]
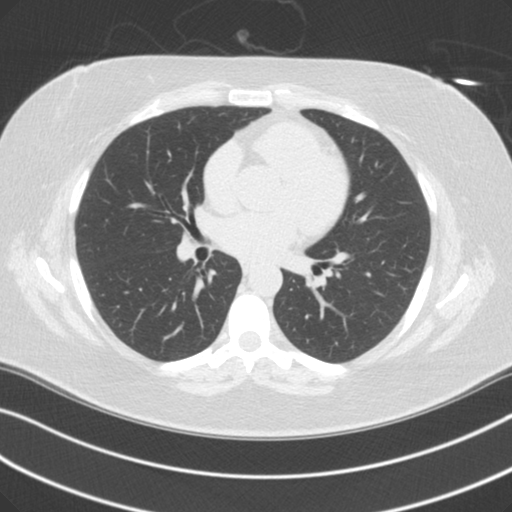
[im 59/69  vessel]
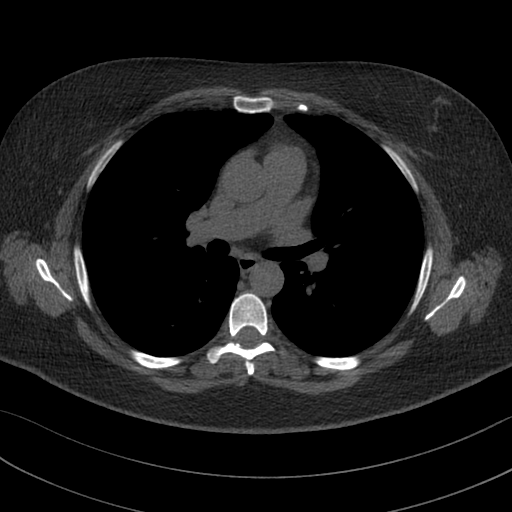

[14 of 20 positions shown; findings below may reference images not displayed]

FINDINGS: Vascular: None.

Mediastinum/Nodes: None.

Lungs/Pleura: None.

Upper Abdomen: Liver may be mildly heterogeneous.

Musculoskeletal: None.
IMPRESSION: 1. No acute extracardiac findings.
2. There may be mildly heterogeneous, raising suspicion for
steatosis.
FINDINGS: Coronary arteries: Normal origins.

Coronary Calcium Score: 0

Percentile: 0

Pericardium: Normal.

Ascending Aorta: Normal caliber.  3.1 cm.

Non-cardiac: See separate report from [REDACTED].
IMPRESSION: Coronary calcium score of 0. This was 0 percentile for age-, race-,
and sex-matched controls.



If CAC=0, it is reasonable to withhold statin therapy and reassess
in 5 to 10 years, as long as higher risk conditions are absent
(diabetes mellitus, family history of premature CHD in first degree
relatives (males <55 years; females <65 years), cigarette smoking,
or LDL >=190 mg/dL).

If CAC is 1 to 99, it is reasonable to initiate statin therapy for
patients >=55 years of age.

If CAC is >=100 or >=75th percentile, it is reasonable to initiate
statin therapy at any age.

Cardiology referral should be considered for patients with CAC
scores >=400 or >=75th percentile.

*8890 AHA/ACC/AACVPR/AAPA/ABC/MOOLMAN/OLIVAREZ/GREFG/Yasmin/XTYLE/SHOKA/JEON
Guideline on the Management of Blood Cholesterol: A Report of the
American College of Cardiology/American Heart Association Task Force
on Clinical Practice Guidelines. J Am Coll Cardiol.
4257;73(24):1661-1743.

*** End of Addendum ***
EXAM:
OVER-READ INTERPRETATION  CT CHEST

The following report is an over-read performed by radiologist Dr.
Niacik Lucianus [REDACTED] on 01/27/2022. This
over-read does not include interpretation of cardiac or coronary
anatomy or pathology. The coronary calcium score/coronary CTA
interpretation by the cardiologist is attached.
FINDINGS: Vascular: None.

Mediastinum/Nodes: None.

Lungs/Pleura: None.

Upper Abdomen: Liver may be mildly heterogeneous.

Musculoskeletal: None.
IMPRESSION: 1. No acute extracardiac findings.
2. There may be mildly heterogeneous, raising suspicion for
steatosis.

## 2022-09-05 ENCOUNTER — Other Ambulatory Visit: Payer: BC Managed Care – PPO

## 2022-09-05 DIAGNOSIS — Z114 Encounter for screening for human immunodeficiency virus [HIV]: Secondary | ICD-10-CM

## 2022-09-06 LAB — HIV ANTIBODY (ROUTINE TESTING W REFLEX): HIV 1&2 Ab, 4th Generation: NONREACTIVE

## 2022-09-11 ENCOUNTER — Ambulatory Visit (AMBULATORY_SURGERY_CENTER): Payer: Self-pay | Admitting: *Deleted

## 2022-09-11 ENCOUNTER — Other Ambulatory Visit: Payer: Self-pay

## 2022-09-11 VITALS — Ht 67.5 in | Wt 185.0 lb

## 2022-09-11 DIAGNOSIS — Z1211 Encounter for screening for malignant neoplasm of colon: Secondary | ICD-10-CM

## 2022-09-11 MED ORDER — NA SULFATE-K SULFATE-MG SULF 17.5-3.13-1.6 GM/177ML PO SOLN: 1.0000 | Freq: Once | ORAL | 0 refills | Status: AC

## 2022-09-11 NOTE — Progress Notes (Signed)
  Pre visit completed over telephone.  Instructions mailed to address on file  No egg or soy allergy known to patient  No issues known to pt with past sedation with any surgeries or procedures Patient denies ever being told they had issues or difficulty with intubation  No FH of Malignant Hyperthermia Pt is not on diet pills Pt is not on  home 02  Pt is not on blood thinners  Pt denies issues with constipation  No A fib or A flutter  Pt instructed to use Singlecare.com or GoodRx for a price reduction on prep

## 2022-09-15 ENCOUNTER — Other Ambulatory Visit: Payer: Self-pay | Admitting: Obstetrics and Gynecology

## 2022-10-07 ENCOUNTER — Encounter: Payer: BC Managed Care – PPO | Admitting: Internal Medicine

## 2022-10-15 ENCOUNTER — Encounter: Payer: Self-pay | Admitting: Internal Medicine

## 2022-10-15 ENCOUNTER — Telehealth: Payer: Self-pay | Admitting: Internal Medicine

## 2022-10-15 NOTE — Telephone Encounter (Signed)
Pt ate corn, beans and nuts yesterday and is concerned about prep not working.  Reviewed instructions with her and advised her to drink plenty of fluids.  Also suggested miralax dose today and tomorrow if she is concerned about her prep not working.

## 2022-10-15 NOTE — Telephone Encounter (Signed)
Inbound call from patient stating that she is scheduled to have a colonoscopy on 12/21 at 9:00 and has questions about her instructions. Patient stated that she may have had something she was not supposed to have. Please advise.

## 2022-10-17 ENCOUNTER — Encounter: Payer: Self-pay | Admitting: Internal Medicine

## 2022-10-17 ENCOUNTER — Ambulatory Visit (AMBULATORY_SURGERY_CENTER): Payer: BC Managed Care – PPO | Admitting: Internal Medicine

## 2022-10-17 VITALS — BP 114/81 | HR 54 | Temp 98.7°F | Resp 15 | Ht 67.5 in | Wt 185.0 lb

## 2022-10-17 DIAGNOSIS — K635 Polyp of colon: Secondary | ICD-10-CM

## 2022-10-17 DIAGNOSIS — D128 Benign neoplasm of rectum: Secondary | ICD-10-CM

## 2022-10-17 DIAGNOSIS — K621 Rectal polyp: Secondary | ICD-10-CM

## 2022-10-17 DIAGNOSIS — Z1211 Encounter for screening for malignant neoplasm of colon: Secondary | ICD-10-CM

## 2022-10-17 DIAGNOSIS — D123 Benign neoplasm of transverse colon: Secondary | ICD-10-CM

## 2022-10-17 MED ORDER — SODIUM CHLORIDE 0.9 % IV SOLN: 500.0000 mL | Freq: Once | INTRAVENOUS | Status: DC

## 2022-10-17 NOTE — Progress Notes (Signed)
GASTROENTEROLOGY PROCEDURE H&P NOTE   Primary Care Physician: Elby Showers, MD    Reason for Procedure:   Colon cancer screening  Plan:    Colonoscopy  Patient is appropriate for endoscopic procedure(s) in the ambulatory ([place]) setting.  The nature of the procedure, as well as the risks, benefits, and alternatives were carefully and thoroughly reviewed with the patient. Ample time for discussion and questions allowed. The patient understood, was satisfied, and agreed to proceed.     HPI: [name] is a 46 y.o. female who presents for colonoscopy for colon cancer screening. Denies blood in stools, changes in bowel habits, or unintentional weight loss. Denies family history of colon cancer.  Past Medical History:  Diagnosis Date   Allergy    Genital warts    Hyperlipidemia    Low back pain    Nephrolithiasis    Vitamin D deficiency     Past Surgical History:  Procedure Laterality Date   BASAL CELL CARCINOMA EXCISION  08/2021   forehead   extraction of wisdom teeth  1999    Prior to Admission medications   Medication Sig Start Date End Date Taking? Authorizing Provider  folic acid (FOLVITE) 1 MG tablet TAKE 1 TABLET(1 MG) BY MOUTH DAILY 03/17/22  Yes Baxley, Cresenciano Lick, MD  simvastatin (ZOCOR) 20 MG tablet TAKE 1 TABLET(20 MG) BY MOUTH DAILY 03/17/22  Yes Baxley, Cresenciano Lick, MD  Vitamin D, Ergocalciferol, (DRISDOL) 1.25 MG (50000 UNIT) CAPS capsule TAKE 1 CAPSULE BY MOUTH 1 TIME A WEEK 03/17/22  Yes Baxley, Cresenciano Lick, MD  cyclobenzaprine (FLEXERIL) 10 MG tablet Take 10 mg by mouth 3 (three) times daily as needed for muscle spasms. Patient not taking: Reported on 09/11/2022    [provider]  EPINEPHrine 0.3 mg/0.3 mL IJ SOAJ injection USE AS DIRECTED AS NEEDED FOR ANAPHYLXASIS    [provider]    Current Outpatient Medications  Medication Sig Dispense Refill   folic acid (FOLVITE) 1 MG tablet TAKE 1 TABLET(1 MG) BY MOUTH DAILY 90 tablet 3    simvastatin (ZOCOR) 20 MG tablet TAKE 1 TABLET(20 MG) BY MOUTH DAILY 90 tablet 3   Vitamin D, Ergocalciferol, (DRISDOL) 1.25 MG (50000 UNIT) CAPS capsule TAKE 1 CAPSULE BY MOUTH 1 TIME A WEEK 12 capsule 4   cyclobenzaprine (FLEXERIL) 10 MG tablet Take 10 mg by mouth 3 (three) times daily as needed for muscle spasms. (Patient not taking: Reported on 09/11/2022)     EPINEPHrine 0.3 mg/0.3 mL IJ SOAJ injection USE AS DIRECTED AS NEEDED FOR ANAPHYLXASIS     Current Facility-Administered Medications  Medication Dose Route Frequency Provider Last Rate Last Admin   0.9 %  sodium chloride infusion  500 mL Intravenous Once Sharyn Creamer, MD        Allergies as of 10/17/2022 - Review Complete 10/17/2022  Allergen Reaction Noted   Clarithromycin Other (See Comments) 11/04/2017    Family History  Problem Relation Age of Onset   Diabetes Mother        At Risk    Cancer Mother        Skin    Heart disease Father        Heart Attack    CAD Father    Diabetes Maternal Grandmother    Hypertension Maternal Grandmother    Colon cancer Neg Hx    Rectal cancer Neg Hx    Esophageal cancer Neg Hx    Stomach cancer Neg Hx  Colon polyps Neg Hx     Social History   Socioeconomic History   Marital status: Single    Spouse name: Not on file   Number of children: 0   Years of education: Not on file   Highest education level: Master's degree (e.g., MA, MS, MEng, MEd, MSW, MBA)  Occupational History    Comment: Teacher [employer]  Tobacco Use   Smoking status: Former    Types: Cigarettes    Quit date: 07/23/1998    Years since quitting: 24.2   Smokeless tobacco: Never  Vaping Use   Vaping Use: Never used  Substance and Sexual Activity   Alcohol use: Yes    Comment: once a week   Drug use: No   Sexual activity: Yes    Partners: Male    Birth control/protection: None  Other Topics Concern   Not on file  Social History Narrative       Social history: She is a Pharmacist, 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  She has a college degree.  Single, never married.  Does not smoke.  2-4 alcoholic drinks per week.  1 brother.  Parents living.  Mother is a retired Marine scientist.  Father is a retired Engineer, structural.  She has a steady female partner.       Family history: Both parents are overweight.  Mother with history of diabetes, hypertension and skin cancer.  Father with history of MI.   Social Determinants of Health   Financial Resource Strain: Not on file  Food Insecurity: Not on file  Transportation Needs: Not on file  Physical Activity: Not on file  Stress: Not on file  Social Connections: Not on file  Intimate Partner Violence: Not on file    Physical Exam: Vital signs in last 24 hours: BP 110/75   Pulse 74   Temp 98.7 F (37.1 C)   Ht 5' 7.5" (1.715 m)   Wt 185 lb (83.9 kg)   SpO2 99%   BMI 28.55 kg/m  GEN: NAD EYE: Sclerae anicteric ENT: MMM CV: Non-tachycardic Pulm: No increased work of breathing GI: Soft, NT/ND NEURO:  Alert & Oriented   Christia Reading, MD [facility]  10/17/2022 9:02 AM

## 2022-10-17 NOTE — Progress Notes (Signed)
Called to room to assist during endoscopic procedure.  Patient ID and intended procedure confirmed with present staff. Received instructions for my participation in the procedure from the performing physician.  

## 2022-10-17 NOTE — Op Note (Signed)
[facility] Patient Name: [name] Procedure Date: 10/17/2022 9:40 AM MRN: [id number] Endoscopist: Adline Mango [place] , , [id number] Age: 46 Referring MD:  Date of Birth: [birth year] Gender: Female Account #: [id number] Procedure:                Colonoscopy Indications:              Screening for colorectal malignant neoplasm, This                            is the patient's first colonoscopy Medicines:                Monitored Anesthesia Care Procedure:                Pre-Anesthesia Assessment:                           - Prior to the procedure, a History and Physical                            was performed, and patient medications and                            allergies were reviewed. The patient's tolerance of                            previous anesthesia was also reviewed. The risks                            and benefits of the procedure and the sedation                            options and risks were discussed with the patient.                            All questions were answered, and informed consent                            was obtained. Prior Anticoagulants: The patient has                            taken no anticoagulant or antiplatelet agents. ASA                            Grade Assessment: II - A patient with mild systemic                            disease. After reviewing the risks and benefits,                            the patient was deemed in satisfactory condition to                            undergo the procedure.  After obtaining informed consent, the colonoscope                            was passed under direct vision. Throughout the                            procedure, the patient's blood pressure, pulse, and                            oxygen saturations were monitored continuously. The                            Olympus CF-HQ190L [id number]) Colonoscope was                            introduced through the  anus and advanced to the the                            terminal ileum. The colonoscopy was performed                            without difficulty. The patient tolerated the                            procedure well. The quality of the bowel                            preparation was good. The terminal ileum, ileocecal                            valve, appendiceal orifice, and rectum were                            photographed. Scope In: 9:54:57 AM Scope Out: 10:19:10 AM Scope Withdrawal Time: 0 hours 19 minutes 31 seconds  Total Procedure Duration: 0 hours 24 minutes 13 seconds  Findings:                 The terminal ileum appeared normal.                           Three sessile polyps were found in the rectum and                            transverse colon. The polyps were 3 to 7 mm in                            size. These polyps were removed with a cold snare.                            Resection and retrieval were complete.                           Non-bleeding internal hemorrhoids were found during  retroflexion. Complications:            No immediate complications. Estimated Blood Loss:     Estimated blood loss was minimal. Impression:               - The examined portion of the ileum was normal.                           - Three 3 to 7 mm polyps in the rectum and in the                            transverse colon, removed with a cold snare.                            Resected and retrieved.                           - Non-bleeding internal hemorrhoids. Recommendation:           - Discharge patient to home (with escort).                           - Await pathology results.                           - The findings and recommendations were discussed                            with the patient. Dr Georgian Co "[place]" [place],  10/17/2022 10:22:15 AM

## 2022-10-17 NOTE — Progress Notes (Signed)
Sedate, gd SR, tolerated procedure well, VSS, report to RN 

## 2022-10-17 NOTE — Progress Notes (Signed)
Pt's states no medical or surgical changes since previsit or office visit. 

## 2022-10-17 NOTE — Patient Instructions (Signed)
Information on polyps given to you today.  Await pathology results.  Resume previous diet and medications.    YOU HAD AN ENDOSCOPIC PROCEDURE TODAY AT THE [facility]:   Refer to the procedure report that was given to you for any specific questions about what was found during the examination.  If the procedure report does not answer your questions, please call your gastroenterologist to clarify.  If you requested that your care partner not be given the details of your procedure findings, then the procedure report has been included in a sealed envelope for you to review at your convenience later.  YOU SHOULD EXPECT: Some feelings of bloating in the abdomen. Passage of more gas than usual.  Walking can help get rid of the air that was put into your GI tract during the procedure and reduce the bloating. If you had a lower endoscopy (such as a colonoscopy or flexible sigmoidoscopy) you may notice spotting of blood in your stool or on the toilet paper. If you underwent a bowel prep for your procedure, you may not have a normal bowel movement for a few days.  Please Note:  You might notice some irritation and congestion in your nose or some drainage.  This is from the oxygen used during your procedure.  There is no need for concern and it should clear up in a day or so.  SYMPTOMS TO REPORT IMMEDIATELY:  Following lower endoscopy (colonoscopy or flexible sigmoidoscopy):  Excessive amounts of blood in the stool  Significant tenderness or worsening of abdominal pains  Swelling of the abdomen that is new, acute  Fever of 100F or higher  For urgent or emergent issues, a gastroenterologist can be reached at any hour by calling [phone]. Do not use MyChart messaging for urgent concerns.    DIET:  We do recommend a small meal at first, but then you may proceed to your regular diet.  Drink plenty of fluids but you should avoid alcoholic beverages for 24 hours.  ACTIVITY:  You should  plan to take it easy for the rest of today and you should NOT DRIVE or use heavy machinery until tomorrow (because of the sedation medicines used during the test).    FOLLOW UP: Our staff will call the number listed on your records the next business day following your procedure.  We will call around 7:15- 8:00 am to check on you and address any questions or concerns that you may have regarding the information given to you following your procedure. If we do not reach you, we will leave a message.     If any biopsies were taken you will be contacted by phone or by letter within the next 1-3 weeks.  Please call us at [phone] if you have not heard about the biopsies in 3 weeks.    SIGNATURES/CONFIDENTIALITY: You and/or your care partner have signed paperwork which will be entered into your electronic medical record.  These signatures attest to the fact that that the information above on your After Visit Summary has been reviewed and is understood.  Full responsibility of the confidentiality of this discharge information lies with you and/or your care-partner.  

## 2022-10-22 ENCOUNTER — Telehealth: Payer: Self-pay | Admitting: *Deleted

## 2022-10-22 NOTE — Telephone Encounter (Signed)
Attempted to call patient for their post-procedure follow-up call. No answer. Left voicemail.   

## 2022-10-23 ENCOUNTER — Telehealth: Payer: Self-pay | Admitting: Internal Medicine

## 2022-10-23 ENCOUNTER — Encounter: Payer: Self-pay | Admitting: Internal Medicine

## 2022-10-23 ENCOUNTER — Ambulatory Visit: Payer: BC Managed Care – PPO | Admitting: Internal Medicine

## 2022-10-23 VITALS — BP 108/70 | HR 77 | Temp 99.1°F

## 2022-10-23 DIAGNOSIS — H6691 Otitis media, unspecified, right ear: Secondary | ICD-10-CM | POA: Diagnosis not present

## 2022-10-23 MED ORDER — FLUCONAZOLE 150 MG PO TABS: 150.0000 mg | ORAL_TABLET | Freq: Once | ORAL | 0 refills | Status: AC

## 2022-10-23 MED ORDER — AMOXICILLIN 500 MG PO CAPS: 500.0000 mg | ORAL_CAPSULE | Freq: Three times a day (TID) | ORAL | 0 refills | Status: DC

## 2022-10-23 NOTE — Patient Instructions (Addendum)
Patient concerned about recent vulvar biopsy and Pap smear results. She saw Dr. Charlesetta Garibaldi but is asking for a referral  concerning these issues.Will see if GYN Oncology will do consultation ( Dr. Jeral Pinch). Amoxicillin 500 mg 3 times daily by mouth for 7 days for acute  right otitis media. Diflucan provided for possible Candida vaginitis while on antibiotics.

## 2022-10-23 NOTE — Telephone Encounter (Signed)
[name] [phone]  Dimond called to say her right ear is hurting and has been for couple of days, it seems to be hurting down into her neck some. She has a little nasal drainage, but know other symptoms. I scheduled her for 2:30 today.

## 2022-10-24 ENCOUNTER — Encounter: Payer: Self-pay | Admitting: Internal Medicine

## 2022-10-24 ENCOUNTER — Telehealth: Payer: Self-pay | Admitting: Internal Medicine

## 2022-10-24 NOTE — Telephone Encounter (Addendum)
This morning, patient sent me a copy of urine culture result from [facility]. Culture was taken December 25th. Result shows 25,000 to 50,000 col/ml of Enterococcus faecalis sensitive to Penicillin.  She went there on December 25th and culture was just resulted apparently today at [facility]. Staff there sent in nitrofurantoin for her today. I do not think she needs both meds now.  Patient was given Amoxicillin here at the office yesterday for serous otitis media. yesterday. Amoxicillin is not mentioned in this urine culture report but may work for the urinary symptoms as well. Just now, I tried to call patient back but could not leave a message for some reason. I would stay with Amoxicillin for now and see if urine symptoms improve. If not, she can start the nitrofurantoin that [facility] gave her if symptoms worsen.  She was here yesterday with complaint of ear discomfort and was treated with Amoxicillin 500 mg 3 times daily for 7 days.  MJB, MD

## 2022-10-24 NOTE — Progress Notes (Deleted)
   Subjective:    Patient ID: [name], female    DOB: Dec 05, 1975, 46 y.o.   MRN: [id number]  HPI    Review of Systems     Objective:   Physical Exam        Assessment & Plan:

## 2022-10-24 NOTE — Progress Notes (Unsigned)
   Subjective:    Patient ID: [name], female    DOB: 1975-12-12, 46 y.o.   MRN: [id number]  HPI 45 year old Female seen today with ear discomfort. No fever or chills. No sore throat. No headache nausea or vomiting. 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  She also has some concerns about her cervical biopsy with Dr. Christena Deem in November and labial biopsy as well as posterior forchette biopsy done at same time.   Recently had colonoscopy by Dr. Dayna Barker with sessile serrated polyp removed and repeat study recommended in 5 years.  Review of Systems see above- no cough or congestion     Objective:   Physical Exam  VS are reviewed. She is afebrile.        Assessment & Plan:  Acute right otitis media  Plan: Amoxicillin 500 mg 3 times daily for 7 days

## 2022-10-24 NOTE — Telephone Encounter (Signed)
[name] [phone]  [name] called to say she had gone to Urgent Care on Tuesday with possible UTI and they called her back this morning and said her culture had grown bacteria and they were calling in Mytrofriecrin and would it be okay to take what you were sending together.

## 2022-10-24 NOTE — Telephone Encounter (Signed)
Called [name] and let her know what Dr Renold Genta said, she verbalized understanding and will call back with any question or more problems. She also will come back in after she completes antibiotic to make sure UTI is cleared up.

## 2022-10-31 ENCOUNTER — Telehealth: Payer: Self-pay | Admitting: *Deleted

## 2022-10-31 NOTE — Telephone Encounter (Signed)
LMOM for the patient to call the office back. Patient needs to be scheduled for a new patient appt with Dr Delsa Sale

## 2022-11-03 ENCOUNTER — Telehealth: Payer: Self-pay | Admitting: Internal Medicine

## 2022-11-03 ENCOUNTER — Encounter: Payer: Self-pay | Admitting: Internal Medicine

## 2022-11-03 ENCOUNTER — Ambulatory Visit (INDEPENDENT_AMBULATORY_CARE_PROVIDER_SITE_OTHER): Payer: BC Managed Care – PPO | Admitting: Internal Medicine

## 2022-11-03 VITALS — BP 116/76 | HR 78 | Temp 98.2°F | Ht 67.5 in | Wt 190.8 lb

## 2022-11-03 DIAGNOSIS — Z0189 Encounter for other specified special examinations: Secondary | ICD-10-CM | POA: Diagnosis not present

## 2022-11-03 DIAGNOSIS — H9201 Otalgia, right ear: Secondary | ICD-10-CM

## 2022-11-03 LAB — POCT URINALYSIS DIPSTICK
Bilirubin, UA: NEGATIVE
Blood, UA: NEGATIVE
Glucose, UA: NEGATIVE
Ketones, UA: NEGATIVE
Leukocytes, UA: NEGATIVE
Nitrite, UA: NEGATIVE
Protein, UA: NEGATIVE
Spec Grav, UA: 1.01 (ref 1.010–1.025)
Urobilinogen, UA: 0.2 E.U./dL
pH, UA: 7 (ref 5.0–8.0)

## 2022-11-03 MED ORDER — FLUCONAZOLE 150 MG PO TABS: 150.0000 mg | ORAL_TABLET | Freq: Once | ORAL | 0 refills | Status: AC

## 2022-11-03 MED ORDER — METHYLPREDNISOLONE ACETATE 80 MG/ML IJ SUSP
80.0000 mg | Freq: Once | INTRAMUSCULAR | Status: AC
  Administered 2022-11-03: 80 mg via INTRAMUSCULAR

## 2022-11-03 MED ORDER — CEFTRIAXONE SODIUM 1 G IJ SOLR
1.0000 g | Freq: Once | INTRAMUSCULAR | Status: AC
  Administered 2022-11-03: 1 g via INTRAMUSCULAR

## 2022-11-03 NOTE — Telephone Encounter (Signed)
Spoke with [name] regarding her referral to GYN oncology. She has an appointment scheduled with Dr. Delsa Sale on 11/19/22 at 2:00pm. Patient agrees to date and time. She has been provided with office address and location. She is also aware of our mask and visitor policy. Patient verbalized understanding and will call with any questions.

## 2022-11-03 NOTE — Telephone Encounter (Signed)
Patient said she used her antibiotics from 10/23/22 but it still feels like the ear infection is there. She would like to be seen again today. Is that ok?

## 2022-11-03 NOTE — Progress Notes (Signed)
   Subjective:    Patient ID: [name], female    DOB: 1976-10-05, 47 y.o.   MRN: [id number]  HPI Seen on December 28 with ear discomfort and was treated with a Zithromax z-pak. Pain went away but still had fullness in right ear.  No fever, chills, nausea, vomiting diarrhea  Patient was diagnosed with a UTI at urgent care center here in [place] on December 25.  Culture grew 25,000-50,000 colony per mL overeater of Enterococcus faecalis sensitive to penicillin.  Staff there sent in nitrofurantoin for her on December 29.  Patient had received amoxicillin in the office on December 28 here for acute right otitis media.  I suggested she stay with amoxicillin instead of the nitrofurantoin.  Also let her know preferred that she not take both medications at one time.  Patient asking now to recheck her urine and this will be done.  Has no symptoms.  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  Review of Systems has postnasal drainage. Has drainage form left nostril.     Objective:   Physical Exam  Temperature is 98.2 degrees, pulse 78 regular blood pressure 116/76 pulse oximetry 99% weight 190 pounds 12.8 ounces height 5 feet 7.5 inches BMI is 29.44    Urine dipstick from clean-catch urine specimen is normal  May have very mild serous otitis media of right ear.  This is bothersome to her and she would like to have it treated today.    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  Her ear does not feel quite right to her and she requests treatment.  Gave her 1 g IM Rocephin and Depo-Medrol 80 mg IM.  That should help decongest her middle ear and treat any impending infection that she has in her middle ear as well.  She will let me know if not responding to this treatment.

## 2022-11-18 ENCOUNTER — Encounter: Payer: Self-pay | Admitting: Obstetrics & Gynecology

## 2022-11-19 ENCOUNTER — Inpatient Hospital Stay: Payer: BC Managed Care – PPO | Attending: Obstetrics & Gynecology | Admitting: Obstetrics & Gynecology

## 2022-11-19 ENCOUNTER — Encounter: Payer: Self-pay | Admitting: Obstetrics & Gynecology

## 2022-11-19 ENCOUNTER — Other Ambulatory Visit: Payer: Self-pay

## 2022-11-19 VITALS — BP 118/83 | HR 86 | Temp 99.0°F | Resp 20 | Wt 190.5 lb

## 2022-11-19 DIAGNOSIS — Z86002 Personal history of in-situ neoplasm of other and unspecified genital organs: Secondary | ICD-10-CM | POA: Insufficient documentation

## 2022-11-19 DIAGNOSIS — A630 Anogenital (venereal) warts: Secondary | ICD-10-CM | POA: Diagnosis not present

## 2022-11-19 DIAGNOSIS — R87612 Low grade squamous intraepithelial lesion on cytologic smear of cervix (LGSIL): Secondary | ICD-10-CM | POA: Insufficient documentation

## 2022-11-19 DIAGNOSIS — Z9889 Other specified postprocedural states: Secondary | ICD-10-CM | POA: Diagnosis not present

## 2022-11-19 DIAGNOSIS — Z8619 Personal history of other infectious and parasitic diseases: Secondary | ICD-10-CM

## 2022-11-19 DIAGNOSIS — N893 Dysplasia of vagina, unspecified: Secondary | ICD-10-CM | POA: Diagnosis present

## 2022-11-19 NOTE — Assessment & Plan Note (Addendum)
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  Among older females, the prevalence of HPV detection might be related to persistence or reactivation of previously acquired infection rather than new, recent infections >Education materials provided >Return prn

## 2022-11-19 NOTE — Progress Notes (Signed)
Follow Up Note: Gyn-Onc  [name] 47 y.o. 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  No recurrence of the lesion.  The histology from a vulva bx in 2020 showed an epidermoid cyst.  A colpo in 11/23 performed by an outside provider showed VIN 1, koilocytic effect. No current lesions, itching. She has queries re: HPV infections of the genital tract.    Review of Systems  Review of Systems  Constitutional:  Negative for malaise/fatigue and weight loss.  Respiratory:  Negative for shortness of breath and wheezing.   Cardiovascular:  Negative for chest pain and leg swelling.  Gastrointestinal:  Negative for abdominal pain, blood in stool, constipation, nausea and vomiting.  Genitourinary:  Negative for dysuria, frequency, hematuria and urgency.  Musculoskeletal:  Negative for joint pain and myalgias.  Neurological:  Negative for weakness.  Psychiatric/Behavioral:  Negative for depression. The patient does not have insomnia.    Current medications, allergy, social history, past surgical history, past medical history, family history were all reviewed.    Vitals:  BP 118/83   Pulse 86   Temp 99 F (37.2 C)   Resp 20   Wt 190 lb 8 oz (86.4 kg)   LMP 09/26/2022   SpO2 99%   BMI 29.40 kg/m    Physical Exam:  Physical Exam Exam conducted with a chaperone present.  Constitutional:      General: She is not in acute distress. Genitourinary:    General: Normal vulva.        Assessment/Plan:  History of HPV infection 47 yo bx-proven vulva SIL; remote h/o VIN 3 treated w/laser ablation Asymptomatic, unremarkable exam   >She was counseled that vulvar low-grade SIL (LSIL) lesions are equivalent to condylomata acuminata (anogenital warts), are not precancerous lesions, should not be considered as potentially neoplastic lesions, and do not need to be treated unless symptomatic    >We also discussed the natural h/o HPV.  Among older females, the prevalence of HPV detection might be related to persistence or reactivation of previously acquired infection rather than new, recent infections >Education materials provided >Return prn   I personally spent 30 minutes face-to-face and non-face-to-face in the care of this patient, which includes all pre, intra, and post visit time on the date of service.    Lahoma Crocker, MD

## 2022-11-19 NOTE — Patient Instructions (Signed)
Return as needed

## 2022-11-19 NOTE — Assessment & Plan Note (Deleted)
Vulvar low-grade SIL (LSIL) are equivalent to condylomata acuminata (anogenital warts), are not precancerous lesions, should not be considered as potentially neoplastic lesions, and do not need to be treated unless symptomatic  Consider imiquimod.

## 2022-11-23 NOTE — Patient Instructions (Addendum)
Urine dipstick today is normal and shows no evidence of UTI.  For persistent otalgia right ear was given 1 g IM Rocephin and 80 mg Depo-Medrol IM.

## 2022-12-03 ENCOUNTER — Telehealth: Payer: Self-pay

## 2022-12-03 NOTE — Telephone Encounter (Signed)
Scheduled

## 2022-12-03 NOTE — Telephone Encounter (Signed)
Patient called stating she fell on Saturday and has a large abrasion on her knee.  [facility] and was given antibiotics.  Patient feels as through her knee is not healing properly and would like to be seen today or tomorrow I informed the patient that a message would be sent to [facility] and someone would call her back to schedule.

## 2022-12-03 NOTE — Progress Notes (Signed)
Patient Care Team: Elby Showers, MD as PCP - General (Internal Medicine)  Visit Date: 12/04/22  Subjective:    Patient ID: [name] , Female   DOB: 1976/01/16, 47 y.o.    MRN: [id number]   47 y.o. Female presents today for an an abrasion of right knee. Was participating in an exercise class Feb 3th, was running and slid down skinning her knee.  Patient has a past medical history of hyperlipidemia, kidney disease, low back pain, nephrolithiasis, Vitamin D deficiency, allergic rhinitis.   Reports redness has expanded. Denies pain in the area unless disturbed.Received Tdap 2019.  She is worried that abrasion is not healing well.  No x-rays were performed at urgent care.  Injured area was dressed and patient was prescribed cephalexin 500 mg every 8 hours for 10 days along with Diflucan in case she develop Candida vaginitis while on antibiotics.  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  Single, never married.  Does not smoke.  2-4 alcoholic drinks per week.  1 brother.  Parents living.  Mother is a retired Marine scientist.  Father is a retired Engineer, structural.  She has a steady female partner.       Family history: Both parents are overweight.  Mother with history of diabetes, hypertension and skin cancer.  Father with history of MI.      Review of Systems   Constitutional:  Negative for fever and malaise/fatigue.  HENT:  Negative for congestion.   Eyes:  Negative for blurred vision.  Respiratory:  Negative for cough and shortness of breath.   Cardiovascular:  Negative for chest pain, palpitations and leg swelling.  Gastrointestinal:  Negative for vomiting.  Musculoskeletal:  Negative for back pain.  Skin:  Negative for rash.       (+) Right knee abrasion  Neurological:  Negative for loss of consciousness and headaches.        Objective:   Vitals: BP 126/82   Pulse 94   Temp 98.8 F (37.1 C) (Tympanic)   Ht 5' 7.5" (1.715 m)   Wt 186 lb 12.8 oz (84.7 kg)   LMP 11/23/2022   SpO2 98%   BMI 28.83 kg/m    Physical Exam Vitals and nursing note reviewed.  Constitutional:      General: She is not in acute distress.    Appearance: Normal appearance. She is not toxic-appearing.  HENT:     Head: Normocephalic and atraumatic.  Pulmonary:     Effort: Pulmonary effort is normal.  Skin:    General: Skin is warm and dry.     Comments: 5x4.5 centimeter abrasion with central circular yellow area that is not draining, surrounding yellow bruising inferiorly  Neurological:     Mental Status: She is alert and oriented to person, place, and time. Mental status is at baseline.  Psychiatric:        Mood and Affect: Mood normal.        Behavior: Behavior normal.        Thought Content: Thought content normal.        Judgment: Judgment normal.       Results:   No labs or x-rays performed today.  Tetanus immunization is up-to-date having been given in 2019.    Labs:       Component Value Date/Time   NA 139 12/25/2020 0939   K 4.7 12/25/2020 0939   CL 105 12/25/2020 0939   CO2 26 12/25/2020 0939   GLUCOSE 79 12/25/2020 0939   BUN 13 12/25/2020 0939   CREATININE 0.84 12/25/2020 0939   CALCIUM 9.5 12/25/2020 0939   PROT 6.6 12/25/2020 0939   ALBUMIN 4.1 10/30/2016 1040   AST 14 12/25/2020 0939   ALT 19 12/25/2020 0939    ALKPHOS 57 10/30/2016 1040   BILITOT 0.4 12/25/2020 0939   GFRNONAA 85 12/25/2020 0939   GFRAA 99 12/25/2020 0939     Lab Results  Component Value Date   WBC 5.0 12/30/2021   HGB 14.5 12/30/2021   HCT 44.8 12/30/2021   MCV 87.7 12/30/2021   PLT 353 12/30/2021    Lab Results  Component Value Date   CHOL 144 12/30/2021   HDL 45 (L) 12/30/2021   LDLCALC 83 12/30/2021   TRIG 76 12/30/2021   CHOLHDL 3.2 12/30/2021    No results found for: "HGBA1C"   Lab Results  Component Value Date   TSH 1.08 12/30/2021      Assessment & Plan:   Right Knee Abrasion: Wound cleaned, Bactroban 2% applied, bandaged. Advised to stop Cefalexin after 5 days, clean with peroxide and apply antibiotic ointment once daily.  Vaccine Counseling: Tetanus given 2019, UTD.     I,Alexander Ruley,acting as a Education administrator for Elby Showers, MD.,have documented all relevant documentation on the behalf of Elby Showers, MD,as directed by  Elby Showers, MD while in the presence of Elby Showers, MD.

## 2022-12-04 ENCOUNTER — Encounter: Payer: Self-pay | Admitting: Internal Medicine

## 2022-12-04 ENCOUNTER — Ambulatory Visit (INDEPENDENT_AMBULATORY_CARE_PROVIDER_SITE_OTHER): Payer: BC Managed Care – PPO | Admitting: Internal Medicine

## 2022-12-04 ENCOUNTER — Telehealth: Payer: Self-pay

## 2022-12-04 VITALS — BP 126/82 | HR 94 | Temp 98.8°F | Ht 67.5 in | Wt 186.8 lb

## 2022-12-04 DIAGNOSIS — S80211A Abrasion, right knee, initial encounter: Secondary | ICD-10-CM

## 2022-12-04 MED ORDER — MUPIROCIN 2 % EX OINT
1.0000 | TOPICAL_OINTMENT | Freq: Two times a day (BID) | CUTANEOUS | 0 refills | Status: DC
Start: 2022-12-04 — End: 2023-01-18

## 2022-12-04 NOTE — Patient Instructions (Signed)
Keep wound clean and dry. Wash with warm soapy water in shower once daily.  Then apply peroxide to wound followed by Mupirocin ointment twice daily. I think 5 days of  prescribed Cephalexin orally is enough. Dress with Telfa and paper tape twice daily until healed.Marland Kitchen

## 2022-12-16 ENCOUNTER — Encounter: Payer: Self-pay | Admitting: Obstetrics & Gynecology

## 2022-12-29 NOTE — Progress Notes (Deleted)
Patient Care Team: Elby Showers, MD as PCP - General (Internal Medicine)  Visit Date: 01/05/23  Subjective:    Patient ID: [name] , Female   DOB: Sep 26, 1976, 47 y.o.    MRN: [id number]   47 y.o. Female presents today for comprehensive physical exam. Patient has a past medical history of allergies, hyperlipidemia, low back pain, nephrolithiasis, Vitamin D deficiency.  Reports feeling well regarding general health.  History of hyperlipidemia treated with Zocor 20 mg daily. Lipid panel normal on 01/02/23.  History of low back pain treated with Flexeril 10 mg three times daily as needed for muscle spasms.   History of Vitamin D deficiency treated with Drisdol 50,000 units weekly.  History of sleep study 2019 showing obstructive sleep apnea. CPAP was recommended but patient declined. History of anxiety but that has improved. History of colposcopy in 2008. In 2019 tested negative for hepatitis B and hepatitis C.  In November 2019 seen by neurologist for spinal stenosis of the lumbar spine. Was found to have spinal stenosis at L4-L5 on MRI and conservative management was recommended. She was treated with physical therapy and improved.  History of allergic rhinitis seen by Dr. Remus Blake. Takes Singulair and Allegra.   Liver, kidney function normal. Glucose, lipids normal. TSH normal at 0.9 on 01/02/23.   Walking and running regularly every week. Not interested in weight loss medication at this time. Would like to try controlling weight with healthy diet and lifestyle.  History of HPV and abnormal Pap smear followed by GYN.  Mammogram last completed 01/29/22. Findings benign by mammographic criteria. Recommended repeat in 2024.   Past Medical History:  Diagnosis Date   Allergy    Genital warts    Hyperlipidemia    Low back pain    Nephrolithiasis    Vitamin D deficiency      Family History  Problem Relation Age of Onset   Diabetes Mother        At Risk    Cancer Mother         Skin    Heart disease Father        Heart Attack    CAD Father    Diabetes Maternal Grandmother    Hypertension Maternal Grandmother    Colon cancer Neg Hx    Rectal cancer Neg Hx    Esophageal cancer Neg Hx    Stomach cancer Neg Hx    Colon polyps Neg Hx     Social History   Social History Narrative       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  Single, never married.  Does not smoke.  2-4 alcoholic drinks per week.  1 brother.  Parents living.  Mother is a retired Marine scientist.  Father is a retired Engineer, structural.  She has a steady female partner.       Family history: Both parents are overweight.  Mother with history of diabetes, hypertension and skin cancer.  Father with history of MI.      Review of Systems  Constitutional:  Negative for chills, fever, malaise/fatigue and weight loss.  HENT:  Negative for hearing loss, sinus pain and sore throat.   Respiratory:  Negative for cough and hemoptysis.   Cardiovascular:  Negative for chest pain, palpitations, leg swelling and PND.  Gastrointestinal:  Negative for abdominal pain, constipation, diarrhea, heartburn, nausea and vomiting.  Genitourinary:  Negative for dysuria, frequency and urgency.  Musculoskeletal:  Negative for back pain, myalgias and neck pain.  Skin:  Negative for itching and rash.  Neurological:  Negative for dizziness, tingling, seizures and headaches.  Endo/Heme/Allergies:  Negative for polydipsia.  Psychiatric/Behavioral:  Negative for depression. The patient is not nervous/anxious.         Objective:   Vitals: BP 110/70   Pulse 64   Temp 98.6 F (37 C) (Tympanic)   Ht '5\' 8"'$  (1.727 m)   Wt 189 lb (85.7 kg)   LMP 12/19/2022   SpO2 98%   BMI 28.74 kg/m    Physical Exam Vitals and nursing note reviewed.  Constitutional:      General: She is not in acute distress.    Appearance: Normal appearance. She is not ill-appearing or toxic-appearing.   HENT:     Head: Normocephalic and atraumatic.     Right Ear: Hearing, tympanic membrane, ear canal and external ear normal.     Left Ear: Hearing, tympanic membrane, ear canal and external ear normal.     Mouth/Throat:     Pharynx: Oropharynx is clear.  Eyes:     Extraocular Movements: Extraocular movements intact.     Pupils: Pupils are equal, round, and reactive to light.  Neck:     Thyroid: No thyroid mass, thyromegaly or thyroid tenderness.     Vascular: No carotid bruit.  Cardiovascular:     Rate and Rhythm: Normal rate and regular rhythm. No extrasystoles are present.    Pulses: Normal pulses.          Dorsalis pedis pulses are 2+ on the right side and 2+ on the left side.       Posterior tibial pulses are 2+ on the right side and 2+ on the left side.     Heart sounds: Normal heart sounds. No murmur heard.    No friction rub. No gallop.  Pulmonary:     Effort: Pulmonary effort is normal.     Breath sounds: Normal breath sounds. No decreased breath sounds, wheezing, rhonchi or rales.  Chest:     Chest wall: No mass.  Abdominal:     Palpations: Abdomen is soft. There is no hepatomegaly, splenomegaly or mass.     Tenderness: There is no abdominal tenderness.     Hernia: No hernia is present.  Musculoskeletal:     Cervical back: Normal range of motion.     Right lower leg: No edema.     Left lower leg: No edema.  Lymphadenopathy:     Cervical: No cervical adenopathy.     Upper Body:     Right upper body: No supraclavicular adenopathy.     Left upper body: No supraclavicular adenopathy.  Skin:    General: Skin is warm and dry.  Neurological:     General: No focal deficit present.     Mental Status: She is alert and oriented to person, place, and time. Mental status is at baseline.     Sensory: Sensation is intact.     Motor: Motor function is intact. No weakness.     Deep Tendon Reflexes: Reflexes are normal and symmetric.  Psychiatric:        Attention and  Perception: Attention normal.        Mood and Affect: Mood normal.        Speech: Speech normal.        Behavior: Behavior normal.        Thought Content: Thought content normal.  Cognition and Memory: Cognition normal.        Judgment: Judgment normal.       Results:   Studies obtained and personally reviewed by me:  Mammogram last completed 01/29/22. Findings benign by mammographic criteria. Recommended repeat in 2024.   Labs:       Component Value Date/Time   NA 140 01/02/2023 0920   K 5.1 01/02/2023 0920   CL 105 01/02/2023 0920   CO2 26 01/02/2023 0920   GLUCOSE 78 01/02/2023 0920   BUN 13 01/02/2023 0920   CREATININE 0.80 01/02/2023 0920   CALCIUM 9.5 01/02/2023 0920   PROT 6.4 01/02/2023 0920   ALBUMIN 4.1 10/30/2016 1040   AST 15 01/02/2023 0920   ALT 12 01/02/2023 0920   ALKPHOS 57 10/30/2016 1040   BILITOT 0.6 01/02/2023 0920   GFRNONAA 85 12/25/2020 0939   GFRAA 99 12/25/2020 0939     Lab Results  Component Value Date   WBC 4.6 01/02/2023   HGB 13.7 01/02/2023   HCT 42.7 01/02/2023   MCV 89.9 01/02/2023   PLT 323 01/02/2023    Lab Results  Component Value Date   CHOL 163 01/02/2023   HDL 66 01/02/2023   LDLCALC 83 01/02/2023   TRIG 68 01/02/2023   CHOLHDL 2.5 01/02/2023    No results found for: "HGBA1C"   Lab Results  Component Value Date   TSH 0.90 01/02/2023      Assessment & Plan:   Hyperlipidemia: treated with Zocor 20 mg daily. Lipid panel normal on 01/02/23.  Low back pain: treated with Flexeril 10 mg three times daily as needed for muscle spasms.   Vitamin D deficiency: treated with Drisdol 50,000 units weekly.  Allergic rhinitis: seen by Dr. Remus Blake. Takes Singulair and Allegra.   BMI 28.74: Walking and running regularly every week. Not interested in weight loss medication at this time. Would like to try controlling weight with healthy diet and lifestyle.  History of HPV and abnormal pap smear followed by GYN. Pelvic  exam deferred.  Mammogram: last completed 01/29/22. Findings benign by mammographic criteria. Recommended repeat in 2024.  Vaccine Counseling: UTD on tetanus.    I,Alexander Ruley,acting as a Education administrator for Elby Showers, MD.,have documented all relevant documentation on the behalf of Elby Showers, MD,as directed by  Elby Showers, MD while in the presence of Elby Showers, MD.   I, Elby Showers, MD, have reviewed all documentation for this visit. The documentation on 01/18/23 for the exam, diagnosis, procedures, and orders are all accurate and complete.

## 2023-01-02 ENCOUNTER — Other Ambulatory Visit: Payer: BC Managed Care – PPO

## 2023-01-02 DIAGNOSIS — Z13220 Encounter for screening for lipoid disorders: Secondary | ICD-10-CM

## 2023-01-02 DIAGNOSIS — Z136 Encounter for screening for cardiovascular disorders: Secondary | ICD-10-CM

## 2023-01-02 DIAGNOSIS — R5383 Other fatigue: Secondary | ICD-10-CM

## 2023-01-03 LAB — CBC WITH DIFFERENTIAL/PLATELET
Absolute Monocytes: 382 cells/uL (ref 200–950)
Basophils Absolute: 51 cells/uL (ref 0–200)
Basophils Relative: 1.1 %
Eosinophils Absolute: 69 cells/uL (ref 15–500)
Eosinophils Relative: 1.5 %
HCT: 42.7 % (ref 35.0–45.0)
Hemoglobin: 13.7 g/dL (ref 11.7–15.5)
Lymphs Abs: 1412 cells/uL (ref 850–3900)
MCH: 28.8 pg (ref 27.0–33.0)
MCHC: 32.1 g/dL (ref 32.0–36.0)
MCV: 89.9 fL (ref 80.0–100.0)
MPV: 10 fL (ref 7.5–12.5)
Monocytes Relative: 8.3 %
Neutro Abs: 2686 cells/uL (ref 1500–7800)
Neutrophils Relative %: 58.4 %
Platelets: 323 10*3/uL (ref 140–400)
RBC: 4.75 10*6/uL (ref 3.80–5.10)
RDW: 12.6 % (ref 11.0–15.0)
Total Lymphocyte: 30.7 %
WBC: 4.6 10*3/uL (ref 3.8–10.8)

## 2023-01-03 LAB — COMPLETE METABOLIC PANEL WITH GFR
AG Ratio: 1.9 (calc) (ref 1.0–2.5)
ALT: 12 U/L (ref 6–29)
AST: 15 U/L (ref 10–35)
Albumin: 4.2 g/dL (ref 3.6–5.1)
Alkaline phosphatase (APISO): 50 U/L (ref 31–125)
BUN: 13 mg/dL (ref 7–25)
CO2: 26 mmol/L (ref 20–32)
Calcium: 9.5 mg/dL (ref 8.6–10.2)
Chloride: 105 mmol/L (ref 98–110)
Creat: 0.8 mg/dL (ref 0.50–0.99)
Globulin: 2.2 g/dL (calc) (ref 1.9–3.7)
Glucose, Bld: 78 mg/dL (ref 65–99)
Potassium: 5.1 mmol/L (ref 3.5–5.3)
Sodium: 140 mmol/L (ref 135–146)
Total Bilirubin: 0.6 mg/dL (ref 0.2–1.2)
Total Protein: 6.4 g/dL (ref 6.1–8.1)
eGFR: 93 mL/min/{1.73_m2} (ref >=60)

## 2023-01-03 LAB — LIPID PANEL
Cholesterol: 163 mg/dL (ref <200)
HDL: 66 mg/dL (ref >=50)
LDL Cholesterol (Calc): 83 mg/dL (calc)
Non-HDL Cholesterol (Calc): 97 mg/dL (calc) (ref <130)
Total CHOL/HDL Ratio: 2.5 (calc) (ref <5.0)
Triglycerides: 68 mg/dL (ref <150)

## 2023-01-03 LAB — TSH: TSH: 0.9 mIU/L

## 2023-01-05 ENCOUNTER — Encounter: Payer: Self-pay | Admitting: Internal Medicine

## 2023-01-05 ENCOUNTER — Ambulatory Visit (INDEPENDENT_AMBULATORY_CARE_PROVIDER_SITE_OTHER): Payer: BC Managed Care – PPO | Admitting: Internal Medicine

## 2023-01-05 VITALS — BP 110/70 | HR 64 | Temp 98.6°F | Ht 68.0 in | Wt 189.0 lb

## 2023-01-05 DIAGNOSIS — Z6828 Body mass index (BMI) 28.0-28.9, adult: Secondary | ICD-10-CM

## 2023-01-05 DIAGNOSIS — Z0000 Encounter for general adult medical examination without abnormal findings: Secondary | ICD-10-CM

## 2023-01-05 DIAGNOSIS — M545 Low back pain, unspecified: Secondary | ICD-10-CM

## 2023-01-05 DIAGNOSIS — R609 Edema, unspecified: Secondary | ICD-10-CM | POA: Diagnosis not present

## 2023-01-05 DIAGNOSIS — E7800 Pure hypercholesterolemia, unspecified: Secondary | ICD-10-CM

## 2023-01-05 DIAGNOSIS — Z8249 Family history of ischemic heart disease and other diseases of the circulatory system: Secondary | ICD-10-CM | POA: Diagnosis not present

## 2023-01-05 DIAGNOSIS — Z8619 Personal history of other infectious and parasitic diseases: Secondary | ICD-10-CM

## 2023-01-05 DIAGNOSIS — J301 Allergic rhinitis due to pollen: Secondary | ICD-10-CM

## 2023-01-05 DIAGNOSIS — Z8639 Personal history of other endocrine, nutritional and metabolic disease: Secondary | ICD-10-CM

## 2023-01-05 LAB — POCT URINALYSIS DIPSTICK
Bilirubin, UA: NEGATIVE
Blood, UA: NEGATIVE
Glucose, UA: NEGATIVE
Ketones, UA: NEGATIVE
Leukocytes, UA: NEGATIVE
Nitrite, UA: NEGATIVE
Protein, UA: NEGATIVE
Spec Grav, UA: 1.01 (ref 1.010–1.025)
Urobilinogen, UA: 0.2 E.U./dL
pH, UA: 6.5 (ref 5.0–8.0)

## 2023-01-05 NOTE — Patient Instructions (Addendum)
It was a pleasure to see you today. Continue current medications and follow up in one year or as needed. May take Flexeril up to 3 times daily as needed for back pain.  Continue high-dose vitamin D weekly.  Continue diet and exercise efforts.  Continue Zocor 20 mg daily and lipid panel is excellent.  Continue close GYN follow-up for history of abnormal Pap smear and HPV.  Repeat mammogram 2024.  You may call for an appointment.

## 2023-01-11 ENCOUNTER — Other Ambulatory Visit: Payer: Self-pay | Admitting: Internal Medicine

## 2023-01-19 NOTE — Progress Notes (Signed)
Patient Care Team: Elby Showers, MD as PCP - General (Internal Medicine)  Visit Date: 01/19/23  Subjective:    Patient ID: [name] , Female   DOB: 03/15/1976, 47 y.o.    MRN: [id number]   47 y.o. Female presents today for comprehensive physical exam. Patient has a past medical history of allergies, hyperlipidemia, low back pain, nephrolithiasis, Vitamin D deficiency.   Reports feeling well regarding general health.   History of hyperlipidemia treated with Zocor 20 mg daily. Lipid panel normal on 01/02/23.   History of low back pain treated with Flexeril 10 mg three times daily as needed for muscle spasms.    History of Vitamin D deficiency treated with Drisdol 50,000 units weekly.   History of sleep study 2019 showing obstructive sleep apnea. CPAP was recommended but patient declined. History of anxiety but that has improved. History of colposcopy in 2008. In 2019 tested negative for hepatitis B and hepatitis C.  In November 2019 seen by neurologist for spinal stenosis of the lumbar spine. Was found to have spinal stenosis at L4-L5 on MRI and conservative management was recommended. She was treated with physical therapy and improved.  History of allergic rhinitis seen by Dr. Remus Blake. Takes Singulair and Allegra.    Liver, kidney function normal. Glucose, lipids normal. TSH normal at 0.9 on 01/02/23.    Walking and running regularly every week. Not interested in weight loss medication at this time. Would like to try controlling weight with healthy diet and lifestyle.   History of HPV and abnormal Pap smear followed by GYN.   Mammogram last completed 01/29/22. Findings benign by mammographic criteria. Recommended repeat in 2024.   Past Medical History:  Diagnosis Date   Allergy    Genital warts    Hyperlipidemia    Low back pain    Nephrolithiasis    Vitamin D deficiency      Family History  Problem Relation Age of Onset   Diabetes Mother        At Risk    Cancer  Mother        Skin    Heart disease Father        Heart Attack    CAD Father    Diabetes Maternal Grandmother    Hypertension Maternal Grandmother    Colon cancer Neg Hx    Rectal cancer Neg Hx    Esophageal cancer Neg Hx    Stomach cancer Neg Hx    Colon polyps Neg Hx     Social History   Social History Narrative       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  Single, never married.  Does not smoke.  2-4 alcoholic drinks per week.  1 brother.  Parents living.  Mother is a retired Marine scientist.  Father is a retired Engineer, structural.  She has a steady female partner.       Family history: Both parents are overweight.  Mother with history of diabetes, hypertension and skin cancer.  Father with history of MI.      Review of Systems  Constitutional:  Negative for chills, fever, malaise/fatigue and weight loss.  HENT:  Negative for hearing loss, sinus pain and sore throat.   Respiratory:  Negative for cough and hemoptysis.   Cardiovascular:  Negative for chest pain, palpitations, leg swelling and PND.  Gastrointestinal:  Negative for abdominal pain, constipation, diarrhea, heartburn, nausea and vomiting.  Genitourinary:  Negative for dysuria, frequency and urgency.  Musculoskeletal:  Negative for back pain, myalgias and neck pain.  Skin:  Negative for itching and rash.  Neurological:  Negative for dizziness, tingling, seizures and headaches.  Endo/Heme/Allergies:  Negative for polydipsia.  Psychiatric/Behavioral:  Negative for depression. The patient is not nervous/anxious.        Objective:   Vitals: BP 110/70   Pulse 64   Temp 98.6 F (37 C) (Tympanic)   Ht 5\' 8"  (1.727 m)   Wt 189 lb (85.7 kg)   LMP 12/19/2022   SpO2 98%   BMI 28.74 kg/m    Physical Exam Vitals and nursing note reviewed.  Constitutional:      General: She is not in acute distress.    Appearance: Normal appearance. She is not ill-appearing or  toxic-appearing.  HENT:     Head: Normocephalic and atraumatic.     Right Ear: Hearing, tympanic membrane, ear canal and external ear normal.     Left Ear: Hearing, tympanic membrane, ear canal and external ear normal.     Mouth/Throat:     Pharynx: Oropharynx is clear.  Eyes:     Extraocular Movements: Extraocular movements intact.     Pupils: Pupils are equal, round, and reactive to light.  Neck:     Thyroid: No thyroid mass, thyromegaly or thyroid tenderness.     Vascular: No carotid bruit.  Cardiovascular:     Rate and Rhythm: Normal rate and regular rhythm. No extrasystoles are present.    Pulses: Normal pulses.          Dorsalis pedis pulses are 2+ on the right side and 2+ on the left side.       Posterior tibial pulses are 2+ on the right side and 2+ on the left side.     Heart sounds: Normal heart sounds. No murmur heard.    No friction rub. No gallop.  Pulmonary:     Effort: Pulmonary effort is normal.     Breath sounds: Normal breath sounds. No decreased breath sounds, wheezing, rhonchi or rales.  Chest:     Chest wall: No mass.  Abdominal:     Palpations: Abdomen is soft. There is no hepatomegaly, splenomegaly or mass.     Tenderness: There is no abdominal tenderness.     Hernia: No hernia is present.  Musculoskeletal:     Cervical back: Normal range of motion.     Right lower leg: No edema.     Left lower leg: No edema.  Lymphadenopathy:     Cervical: No cervical adenopathy.     Upper Body:     Right upper body: No supraclavicular adenopathy.     Left upper body: No supraclavicular adenopathy.  Skin:    General: Skin is warm and dry.  Neurological:     General: No focal deficit present.     Mental Status: She is alert and oriented to person, place, and time. Mental status is at baseline.     Sensory: Sensation is intact.     Motor: Motor function is intact. No weakness.     Deep Tendon Reflexes: Reflexes are normal and symmetric.  Psychiatric:         Attention and Perception: Attention normal.        Mood and Affect: Mood normal.        Speech: Speech normal.        Behavior: Behavior normal.        Thought  Content: Thought content normal.        Cognition and Memory: Cognition normal.        Judgment: Judgment normal.    Results:   Studies obtained and personally reviewed by me:  Mammogram last completed 01/29/22. Findings benign by mammographic criteria. Recommended repeat in 2024.   Labs:       Component Value Date/Time   NA 140 01/02/2023 0920   K 5.1 01/02/2023 0920   CL 105 01/02/2023 0920   CO2 26 01/02/2023 0920   GLUCOSE 78 01/02/2023 0920   BUN 13 01/02/2023 0920   CREATININE 0.80 01/02/2023 0920   CALCIUM 9.5 01/02/2023 0920   PROT 6.4 01/02/2023 0920   ALBUMIN 4.1 10/30/2016 1040   AST 15 01/02/2023 0920   ALT 12 01/02/2023 0920   ALKPHOS 57 10/30/2016 1040   BILITOT 0.6 01/02/2023 0920   GFRNONAA 85 12/25/2020 0939   GFRAA 99 12/25/2020 0939     Lab Results  Component Value Date   WBC 4.6 01/02/2023   HGB 13.7 01/02/2023   HCT 42.7 01/02/2023   MCV 89.9 01/02/2023   PLT 323 01/02/2023    Lab Results  Component Value Date   CHOL 163 01/02/2023   HDL 66 01/02/2023   LDLCALC 83 01/02/2023   TRIG 68 01/02/2023   CHOLHDL 2.5 01/02/2023    No results found for: "HGBA1C"   Lab Results  Component Value Date   TSH 0.90 01/02/2023      Assessment & Plan:   Hyperlipidemia: treated with Zocor 20 mg daily. Lipid panel normal on 01/02/23.   Low back pain: treated with Flexeril 10 mg three times daily as needed for muscle spasms.    Vitamin D deficiency: treated with Drisdol 50,000 units weekly.  Allergic rhinitis: seen by Dr. Remus Blake. Takes Singulair and Allegra.    BMI 28.74: Walking and running regularly every week. Not interested in weight loss medication at this time. Would like to try controlling weight with healthy diet and lifestyle.   History of HPV and abnormal pap smear followed by  GYN. Pelvic exam deferred.   Mammogram: last completed 01/29/22. Findings benign by mammographic criteria. Recommended repeat in 2024.   Vaccine Counseling: UTD on tetanus.       I,Alexander Ruley,acting as a Education administrator for Elby Showers, MD.,have documented all relevant documentation on the behalf of Elby Showers, MD,as directed by  Elby Showers, MD while in the presence of Elby Showers, MD.     I, Elby Showers, MD, have reviewed all documentation for this visit. The documentation on 01/18/23 for the exam, diagnosis, procedures, and orders are all accurate and complete.

## 2023-03-10 ENCOUNTER — Other Ambulatory Visit: Payer: Self-pay | Admitting: Obstetrics and Gynecology

## 2023-03-10 ENCOUNTER — Encounter: Payer: Self-pay | Admitting: Obstetrics and Gynecology

## 2023-03-10 DIAGNOSIS — N6310 Unspecified lump in the right breast, unspecified quadrant: Secondary | ICD-10-CM

## 2023-03-14 DIAGNOSIS — M25562 Pain in left knee: Secondary | ICD-10-CM | POA: Insufficient documentation

## 2023-03-19 ENCOUNTER — Other Ambulatory Visit: Payer: Self-pay | Admitting: Obstetrics and Gynecology

## 2023-03-19 DIAGNOSIS — R928 Other abnormal and inconclusive findings on diagnostic imaging of breast: Secondary | ICD-10-CM

## 2023-04-01 ENCOUNTER — Ambulatory Visit
Admission: RE | Admit: 2023-04-01 | Discharge: 2023-04-01 | Disposition: A | Discharge status: Home / Self Care | Payer: BC Managed Care – PPO | Source: Ambulatory Visit | Attending: Obstetrics and Gynecology | Admitting: Obstetrics and Gynecology

## 2023-04-01 ENCOUNTER — Other Ambulatory Visit: Payer: Self-pay | Admitting: Obstetrics and Gynecology

## 2023-04-01 DIAGNOSIS — N6310 Unspecified lump in the right breast, unspecified quadrant: Secondary | ICD-10-CM

## 2023-04-01 DIAGNOSIS — R928 Other abnormal and inconclusive findings on diagnostic imaging of breast: Secondary | ICD-10-CM

## 2023-04-01 LAB — HM MAMMOGRAPHY: HM Mammogram: ABNORMAL — AB (ref 0–4)

## 2023-04-03 ENCOUNTER — Other Ambulatory Visit: Payer: Self-pay | Admitting: Obstetrics and Gynecology

## 2023-04-03 ENCOUNTER — Ambulatory Visit
Admission: RE | Admit: 2023-04-03 | Discharge: 2023-04-03 | Disposition: A | Discharge status: Home / Self Care | Payer: BC Managed Care – PPO | Source: Ambulatory Visit | Attending: Obstetrics and Gynecology | Admitting: Obstetrics and Gynecology

## 2023-04-03 DIAGNOSIS — N6310 Unspecified lump in the right breast, unspecified quadrant: Secondary | ICD-10-CM

## 2023-04-03 DIAGNOSIS — R928 Other abnormal and inconclusive findings on diagnostic imaging of breast: Secondary | ICD-10-CM

## 2023-04-03 HISTORY — PX: BREAST BIOPSY: SHX20

## 2023-04-05 ENCOUNTER — Other Ambulatory Visit: Payer: Self-pay | Admitting: Internal Medicine

## 2023-05-04 DIAGNOSIS — H9313 Tinnitus, bilateral: Secondary | ICD-10-CM | POA: Insufficient documentation

## 2023-05-04 DIAGNOSIS — M26609 Unspecified temporomandibular joint disorder, unspecified side: Secondary | ICD-10-CM | POA: Insufficient documentation

## 2023-05-04 DIAGNOSIS — H6993 Unspecified Eustachian tube disorder, bilateral: Secondary | ICD-10-CM | POA: Insufficient documentation

## 2023-05-07 ENCOUNTER — Telehealth: Payer: Self-pay | Admitting: Otolaryngology

## 2023-05-07 NOTE — Telephone Encounter (Signed)
Patient lvm about scheduling an appointment. I returned patients call and lvm and let know we do need a referral before scheduling

## 2023-05-12 ENCOUNTER — Encounter: Payer: Self-pay | Admitting: Internal Medicine

## 2023-05-21 ENCOUNTER — Other Ambulatory Visit: Payer: Self-pay | Admitting: Internal Medicine

## 2023-07-01 NOTE — Progress Notes (Addendum)
Patient Care Team: Margaree Mackintosh, MD as PCP - General (Internal Medicine)  Visit Date: 07/02/23  Subjective:    Patient ID: [name] , Female   DOB: 10-19-1976, 47 y.o.    MRN: [id number]   47 y.o. Female presents today for worsening leg cramps since 7/24. Taking cyclobenzaprine 10 mg three times daily as needed. Right leg is worse than left. She is hydrating well. Alcohol intake is minimal. Electrolytes normal in 3/24. 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  She has a college degree.  Single, never married.  Does not smoke.  2-4 alcoholic drinks per week.  1 brother.  Parents living.  Mother is a retired Engineer, civil (consulting).  Father is a retired Emergency planning/management officer.  She has a steady female partner.       Family history: Both parents are overweight.  Mother with history of diabetes, hypertension and skin cancer.  Father with history of MI.      Review of Systems  Constitutional:  Negative for fever and malaise/fatigue.  HENT:  Negative for congestion.   Eyes:  Negative for blurred vision.  Respiratory:  Negative for cough and shortness of breath.   Cardiovascular:  Negative for chest pain, palpitations and leg swelling.  Gastrointestinal:  Negative for vomiting.  Musculoskeletal:  Negative for back pain.        (+) Muscle spasms lower extremities  Skin:  Negative for rash.  Neurological:  Negative for loss of consciousness and headaches.        Objective:   Vitals: BP 110/80   Pulse 72   Ht 5\' 8"  (1.727 m)   Wt 192 lb (87.1 kg)   LMP 06/22/2023   SpO2 98%   BMI 29.19 kg/m    Physical Exam Vitals and nursing note reviewed.  Constitutional:      General: She is not in acute distress.    Appearance: Normal appearance. She is not toxic-appearing.  HENT:     Head: Normocephalic and atraumatic.  Pulmonary:     Effort: Pulmonary effort is normal.  Skin:    General: Skin is warm and dry.  Neurological:     Mental Status: She is alert and oriented to person, place, and time. Mental status is at baseline.  Psychiatric:        Mood and Affect: Mood normal.        Behavior: Behavior normal.        Thought Content: Thought  content normal.        Judgment: Judgment normal.       Results:   Studies obtained and personally reviewed by me:   Labs:       Component Value Date/Time   NA 140 01/02/2023 0920   K 5.1 01/02/2023 0920   CL 105 01/02/2023 0920   CO2 26 01/02/2023 0920   GLUCOSE 78 01/02/2023 0920   BUN 13 01/02/2023 0920   CREATININE 0.80 01/02/2023 0920   CALCIUM 9.5 01/02/2023 0920   PROT 6.4 01/02/2023 0920   ALBUMIN 4.1 10/30/2016 1040   AST 15 01/02/2023 0920   ALT 12 01/02/2023 0920   ALKPHOS 57 10/30/2016 1040   BILITOT 0.6 01/02/2023 0920   GFRNONAA 85 12/25/2020 0939   GFRAA 99 12/25/2020 0939     Lab Results  Component Value Date   WBC 4.6 01/02/2023   HGB 13.7 01/02/2023   HCT 42.7 01/02/2023   MCV 89.9 01/02/2023   PLT 323 01/02/2023    Lab Results  Component Value Date   CHOL 163 01/02/2023   HDL 66 01/02/2023   LDLCALC 83 01/02/2023   TRIG 68 01/02/2023   CHOLHDL 2.5 01/02/2023    No results found for: "HGBA1C"   Lab Results  Component Value Date   TSH 0.90 01/02/2023      Assessment & Plan:   Leg cramps-benign but  aggravating involving bilateral lower extremities: discussed treatments including prescription medication, OTC options, stretching, ice therapy. Prescribed gabapentin 300 mg at bedtime  Contact us if symptoms do not improve.    I,Alexander Ruley,acting as a Neurosurgeon for Margaree Mackintosh, MD.,have documented all relevant documentation on the behalf of Margaree Mackintosh, MD,as directed by  Margaree Mackintosh, MD while in the presence of Margaree Mackintosh, MD.   I, Margaree Mackintosh, MD, have reviewed all documentation for this visit. The documentation on 07/11/23 for the exam, diagnosis, procedures, and orders are all accurate and complete.

## 2023-07-02 ENCOUNTER — Encounter: Payer: Self-pay | Admitting: Internal Medicine

## 2023-07-02 ENCOUNTER — Ambulatory Visit: Payer: BC Managed Care – PPO | Admitting: Internal Medicine

## 2023-07-02 VITALS — BP 110/80 | HR 72 | Ht 68.0 in | Wt 192.0 lb

## 2023-07-02 DIAGNOSIS — R252 Cramp and spasm: Secondary | ICD-10-CM

## 2023-07-11 DIAGNOSIS — R252 Cramp and spasm: Secondary | ICD-10-CM | POA: Insufficient documentation

## 2023-07-11 NOTE — Patient Instructions (Addendum)
Have prescribed gabapentin 300 mg at bedtime for leg cramps on a regular basis.  Dose can be increased if needed.  Try this for several weeks and let us know if no improvement.

## 2023-07-23 ENCOUNTER — Telehealth: Payer: Self-pay | Admitting: Internal Medicine

## 2023-07-23 NOTE — Telephone Encounter (Signed)
[name] [phone]  Christol called to see if you would call her in the 1 pill for a yeast infection. She went to the dentist and has an infection and they called in amoxicillin 500mg  to take every 8 hours she got 22 pills. They also called in Flucanazole 150 mg 7 pills, however the pharmacist told her she need to take 1/2 half of her simvastatin. So that alarmed her and she would rather have what you usually send in for her and just take 1 and not mess with her other medication.

## 2023-07-23 NOTE — Telephone Encounter (Signed)
Called patient and let her know it was the same exact thing that she calls in.

## 2023-10-01 ENCOUNTER — Ambulatory Visit: Payer: BC Managed Care – PPO | Admitting: Internal Medicine

## 2023-10-01 VITALS — BP 110/80 | HR 88 | Ht 68.0 in | Wt 189.0 lb

## 2023-10-01 DIAGNOSIS — J029 Acute pharyngitis, unspecified: Secondary | ICD-10-CM

## 2023-10-01 DIAGNOSIS — H6503 Acute serous otitis media, bilateral: Secondary | ICD-10-CM

## 2023-10-01 DIAGNOSIS — J069 Acute upper respiratory infection, unspecified: Secondary | ICD-10-CM | POA: Diagnosis not present

## 2023-10-01 LAB — POCT RAPID STREP A (OFFICE): Rapid Strep A Screen: NEGATIVE

## 2023-10-01 NOTE — Progress Notes (Deleted)
   Subjective:    Patient ID: [name], female    DOB: [birth year], 47 y.o.   MRN: [id number]  HPI    Review of Systems     Objective:   Physical Exam        Assessment & Plan:

## 2023-10-01 NOTE — Progress Notes (Signed)
Patient Care Team: Margaree Mackintosh, MD as PCP - General (Internal Medicine)  Visit Date: 10/01/23  Subjective:    Patient ID: [name] , Female   DOB: January 08, 1976, 47 y.o.    MRN: [id number]   47 y.o. Female presents today for sore throat for several weeks. Went on a cold nighttime fishing trip in early November and woke up with post-nasal drip, congestion. Sore throat was improving but started to worsen yesterday. She feels some swelling at the back of her tongue. Had some discoloration on tonsils that has cleared. Has some fullness in ears. She has stopped taking her antihistamine. Using throat lozenges.  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  She has a college degree.  Single, never married.  Does not smoke.  2-4 alcoholic drinks per week.  1 brother.  Parents living.  Mother is a retired Engineer, civil (consulting).  Father is a retired Emergency planning/management officer.  She has a steady female partner.       Family history: Both parents are overweight.  Mother with history of diabetes, hypertension and skin cancer.  Father with history of MI.      Review of Systems  Constitutional:  Negative for fever and malaise/fatigue.  HENT:  Positive for ear pain (Fullness) and sore throat. Negative for congestion.   Eyes:  Negative for blurred vision.  Respiratory:  Negative for cough and shortness of breath.    Cardiovascular:  Negative for chest pain, palpitations and leg swelling.  Gastrointestinal:  Negative for vomiting.  Musculoskeletal:  Negative for back pain.  Skin:  Negative for rash.  Neurological:  Negative for loss of consciousness and headaches.        Objective:   Vitals: BP 110/80   Pulse 88   Ht 5\' 8"  (1.727 m)   Wt 189 lb (85.7 kg)   LMP 09/05/2023   SpO2 98%   BMI 28.74 kg/m    Physical Exam Vitals and nursing note reviewed.  Constitutional:      General: She is not in acute distress.    Appearance: Normal appearance. She is not toxic-appearing.  HENT:     Head: Normocephalic and atraumatic.     Right Ear: Hearing, ear canal and external ear normal.     Left Ear: Hearing, ear canal and external ear normal.     Ears:     Comments: Left TM slightly full, pink. Right TM less full.    Mouth/Throat:     Comments: Pharynx slightly injected without exudate. Pulmonary:     Effort:  Pulmonary effort is normal. No respiratory distress.     Breath sounds: Normal breath sounds. No wheezing or rales.  Skin:    General: Skin is warm and dry.  Neurological:     Mental Status: She is alert and oriented to person, place, and time. Mental status is at baseline.  Psychiatric:        Mood and Affect: Mood normal.        Behavior: Behavior normal.        Thought Content: Thought content normal.        Judgment: Judgment normal.       Results:   Studies obtained and personally reviewed by me:   Labs:       Component Value Date/Time   NA 140 01/02/2023 0920   K 5.1 01/02/2023 0920   CL 105 01/02/2023 0920   CO2 26 01/02/2023 0920   GLUCOSE 78 01/02/2023 0920   BUN 13 01/02/2023 0920   CREATININE 0.80 01/02/2023 0920   CALCIUM 9.5 01/02/2023 0920   PROT 6.4 01/02/2023 0920   ALBUMIN 4.1 10/30/2016 1040   AST 15 01/02/2023 0920   ALT 12 01/02/2023 0920   ALKPHOS 57 10/30/2016 1040   BILITOT 0.6 01/02/2023 0920   GFRNONAA 85 12/25/2020 0939   GFRAA 99  12/25/2020 0939     Lab Results  Component Value Date   WBC 4.6 01/02/2023   HGB 13.7 01/02/2023   HCT 42.7 01/02/2023   MCV 89.9 01/02/2023   PLT 323 01/02/2023    Lab Results  Component Value Date   CHOL 163 01/02/2023   HDL 66 01/02/2023   LDLCALC 83 01/02/2023   TRIG 68 01/02/2023   CHOLHDL 2.5 01/02/2023    No results found for: "HGBA1C"   Lab Results  Component Value Date   TSH 0.90 01/02/2023      Assessment & Plan:   Persistent upper respiratory infection: strep test negative. Prescribed Z-Pak two tabs day 1 followed by one tab days 2-5, Diflucan 150 mg once if needed for Candida vaginitis, Hycodan syrup 1 tsp every 8 hours as needed for cough. Get plenty of rest and stay well-hydrated. Contact us if symptoms worsen or fail to improve.   Vaccine counseling: she will get a Covid-19 booster at the pharmacy when she has fully recovered.    I,Alexander Ruley,acting as a Neurosurgeon for Margaree Mackintosh, MD.,have documented all relevant documentation on the behalf of Margaree Mackintosh, MD,as directed by  Margaree Mackintosh, MD while in the presence of Margaree Mackintosh, MD.   I, Margaree Mackintosh, MD, have reviewed all documentation for this visit. The documentation on 10/22/23 for the exam, diagnosis, procedures, and orders are all accurate and complete.

## 2023-10-02 ENCOUNTER — Telehealth: Payer: Self-pay | Admitting: Internal Medicine

## 2023-10-02 MED ORDER — AZITHROMYCIN 250 MG PO TABS: ORAL_TABLET | ORAL | 0 refills | Status: AC

## 2023-10-02 MED ORDER — HYDROCODONE BIT-HOMATROP MBR 5-1.5 MG/5ML PO SOLN: 5.0000 mL | Freq: Three times a day (TID) | ORAL | 0 refills | Status: DC | PRN

## 2023-10-02 MED ORDER — FLUCONAZOLE 150 MG PO TABS: 150.0000 mg | ORAL_TABLET | Freq: Once | ORAL | 1 refills | Status: AC

## 2023-10-02 NOTE — Telephone Encounter (Signed)
Patient is calling in to follow up her medications that where suppose to be called in yesterday by the dr that was not called in

## 2023-10-02 NOTE — Telephone Encounter (Signed)
Marvin called checking on medication from yesterday visit

## 2023-10-08 ENCOUNTER — Encounter: Payer: Self-pay | Admitting: Internal Medicine

## 2023-10-13 DIAGNOSIS — N926 Irregular menstruation, unspecified: Secondary | ICD-10-CM | POA: Insufficient documentation

## 2023-10-19 ENCOUNTER — Encounter: Payer: Self-pay | Admitting: Internal Medicine

## 2023-10-22 NOTE — Patient Instructions (Signed)
Please take Zithromax Z-PAK 2 tabs day 1 followed by 1 tab days 2 through 5.  May take Diflucan one-time tablet if needed for Candida vaginitis while on antibiotics.  Hycodan 1 teaspoon every 8 hours if needed for cough.  Rest and stay well-hydrated.  Contact us if symptoms worsen or fail to improve.  Recommend COVID-vaccine when you have completely recovered from this illness.

## 2023-11-02 ENCOUNTER — Telehealth: Payer: Self-pay | Admitting: *Deleted

## 2023-11-02 NOTE — Telephone Encounter (Signed)
 Patient called and stated I wanted to see if this problem I have is ok to wait until Feb to be seen by my regular GYN. Since August each month I have being having spotting 5-10 days before my period. I do have a regular period. The GYN wants to see me and for US /pap/bxs. I have no changes with my bowel, bladder, appetite, and no pain/bloating.

## 2023-11-02 NOTE — Telephone Encounter (Signed)
 Spoke with Ms. Luviano who states she is scheduled for a pelvic ultrasound with [facility] on February 27 th because of some spotting that she is having prior to her period each month starting back in August. The spotting in August was 8 days prior to her normal 5-7 day period, and the spotting ranges from 2-10 days prior to her period. She denies fever, chills,  pelvic pain, and also no complaints of pain with intercourse and no complains of urinary symptoms. Pt reassured that it's ok to follow with her Ob/gyn first. Pt advised this could be pre-menopause symptoms and if her symptoms worsen or with any new symptoms to reach out to provider. Pt verbalized understanding and thanked the office for calling.

## 2023-12-22 ENCOUNTER — Other Ambulatory Visit: Payer: Self-pay | Admitting: Obstetrics and Gynecology

## 2023-12-25 LAB — SURGICAL PATHOLOGY

## 2024-01-12 ENCOUNTER — Telehealth: Payer: Self-pay

## 2024-01-12 NOTE — Telephone Encounter (Signed)
 Communication  Patient is requesting blood work be done   [employer] and spoke with pt. Pt requested to be scheduled for her CPE labs. Pt scheduled.

## 2024-02-03 ENCOUNTER — Other Ambulatory Visit: Payer: Self-pay | Admitting: Internal Medicine

## 2024-02-04 ENCOUNTER — Other Ambulatory Visit: Payer: Self-pay

## 2024-03-16 NOTE — Progress Notes (Signed)
 Annual Wellness Visit   Patient Care Team: Sylvan Evener, MD as PCP - General (Internal Medicine)  Visit Date: 03/28/24   Chief Complaint  Patient presents with   Annual Exam   Subjective:  Patient: [name], Female DOB: Aug 16, 1976, 48 y.o. MRN: [id number] [name] is a 48 y.o. Female who presents today for her Annual Wellness Visit. Patient has Leg Pain, Right; Pure Hypercholesterolemia; Low Back Pain; Vitamin-D Deficiency; History Of HPV Infection; Allergic Rhinitis; History of Kidney Stones; Lumbar Disc Disease; History of Migraine Headaches; Obesity; Obstructive Sleep Apnea Syndrome; HPV in Female; Mass of Vulva; Vaginal Intraepithelial Neoplasia; Genital Warts; and Leg Cramps.  History of Pure Hypercholesterolemia treated with Simvastatin  20 mg daily. 03/22/2024 Lipid Panel WNL.  History of Vitamin-D Deficiency treated with Vitamin-D 50,000 units weekly.   History of Spinal Stenosis, L4-5; Low Back Pain treated with Flexeril  10 mg three times daily as needed. Lumbar Stenosis seen on MRI in 08/2018 after being seen by neurology.   History of Obesity treated with regular exercise and diet management.   History of Obstructive Sleep Apnea per 2019 sleep study not currently treated.   History of Allergic Rhinitis managed with Flonase, Allegra, Mucinex, and is prescribed an EpiPen as needed. Followed by Allergist, Dr. Almeda Jacobs.   Labs 03/22/2024 CBC: WNL CMP: WNL  TSH: 1.13  History of HPV; Abnormal PAP Smear, last completed 03/02/2024 via Gynecologist, Dr. Mills Alma. Also had Mammogram 03/02/2024 through gyn's office.   Colonoscopy 10/17/2022 removed three sessile 3-7 mm polyps total from rectum and transverse colon (2 sessile serrated polyps w/o cytologic dysplasia and 1 lymphoid aggregate per pathology); found Non-bleeding Internal Hemorrhoids during retroflexion; exam otherwise normal with repeat recommendation of 2028.  Bone Density will be due 2043.   Vaccine  Counseling: Due for Covid-19; UTD on Flu and Tdap. NEGATIVE Hepatitis B and Hepatitis C Screening in 2019.  Past Medical History:  Diagnosis Date   Allergy    Genital warts    Hyperlipidemia    Low back pain    Nephrolithiasis    Vitamin D  deficiency    Medical/Surgical History Narrative:  Allergic/Intolerant to: Clarithromycin  - severe GI upset  2022 - Basal Cell Carcinoma Excision on forehead  2021 - Left Ankle Sprain in September; X-rays negative for fracture  2007 - Vaginal Lesion Removal   1999 - Wisdom Teeth Extraction Family History  Problem Relation Age of Onset   Diabetes Mother        At Risk    Cancer Mother        Skin    Heart disease Father        Heart Attack    CAD Father    Diabetes Maternal Grandmother    Hypertension Maternal Grandmother    Colon cancer Neg Hx    Rectal cancer Neg Hx    Esophageal cancer Neg Hx    Stomach cancer Neg Hx    Colon polyps Neg Hx    Social History   Social History Narrative   Works as a Runner, 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  Has a college degree.  Never married, but has a steady female partner.  Non-smoker, drinkers 2-4 alcoholic drinks/week.  1 brother.          Fhx:   No Family History of GI Cancers or Colon Polyps   Father (retired Emergency planning/management officer), living, w/ hx of Coronary Artery Disease, Heart Attack, and Obesity   Maternal Grandmother w/ hx of Diabetes  and Hypertension   Mother (retired Engineer, civil (consulting)), living, w/ hx of Obesity, Diabetes, Hypertension, and Skin Cancer  Review of Systems  Constitutional:  Negative for chills, fever, malaise/fatigue and weight loss.  HENT:  Negative for hearing loss, sinus pain and sore throat.   Respiratory:  Negative for cough, hemoptysis and shortness of breath.   Cardiovascular:  Negative for chest pain, palpitations, leg swelling and PND.  Gastrointestinal:  Negative for abdominal pain, constipation, diarrhea, heartburn, nausea and vomiting.  Genitourinary:  Negative for  dysuria, frequency and urgency.  Musculoskeletal:  Negative for back pain, myalgias and neck pain.  Skin:  Negative for itching and rash.  Neurological:  Negative for dizziness, tingling, seizures and headaches.  Endo/Heme/Allergies:  Negative for polydipsia.  Psychiatric/Behavioral:  Negative for depression. The patient is not nervous/anxious.     Objective:  Vitals: BP 110/70   Pulse 83   Ht 5\' 8"  (1.727 m)   Wt 201 lb (91.2 kg)   LMP 03/21/2024   SpO2 97%   BMI 30.56 kg/m  Physical Exam Vitals and nursing note reviewed.  Constitutional:      General: She is not in acute distress.    Appearance: Normal appearance. She is not ill-appearing or toxic-appearing.  HENT:     Head: Normocephalic and atraumatic.     Right Ear: Hearing, tympanic membrane, ear canal and external ear normal.     Left Ear: Hearing, tympanic membrane, ear canal and external ear normal.     Mouth/Throat:     Pharynx: Oropharynx is clear.  Eyes:     Extraocular Movements: Extraocular movements intact.     Pupils: Pupils are equal, round, and reactive to light.  Neck:     Thyroid: No thyroid mass, thyromegaly or thyroid tenderness.     Vascular: No carotid bruit.  Cardiovascular:     Rate and Rhythm: Normal rate and regular rhythm. No extrasystoles are present.    Pulses:          Dorsalis pedis pulses are 2+ on the right side and 2+ on the left side.     Heart sounds: Normal heart sounds. No murmur heard.    No friction rub. No gallop.  Pulmonary:     Effort: Pulmonary effort is normal.     Breath sounds: Normal breath sounds. No decreased breath sounds, wheezing, rhonchi or rales.  Chest:     Chest wall: No mass.     Comments: Breast exam deferred to GYN Abdominal:     Palpations: Abdomen is soft. There is no hepatomegaly, splenomegaly or mass.     Tenderness: There is no abdominal tenderness.     Hernia: No hernia is present.  Genitourinary:    Comments: PAP & Pelvic exam deferred to  GYN Musculoskeletal:     Cervical back: Normal range of motion.     Right lower leg: No edema.     Left lower leg: No edema.  Lymphadenopathy:     Cervical: No cervical adenopathy.     Upper Body:     Right upper body: No supraclavicular adenopathy.     Left upper body: No supraclavicular adenopathy.  Skin:    General: Skin is warm and dry.  Neurological:     General: No focal deficit present.     Mental Status: She is alert and oriented to person, place, and time. Mental status is at baseline.     Sensory: Sensation is intact.     Motor: Motor function is intact. No  weakness.     Deep Tendon Reflexes: Reflexes are normal and symmetric.  Psychiatric:        Attention and Perception: Attention normal.        Mood and Affect: Mood normal.        Speech: Speech normal.        Behavior: Behavior normal.        Thought Content: Thought content normal.        Cognition and Memory: Cognition normal.        Judgment: Judgment normal.   Most Recent Fall Risk Assessment:    01/05/2023   11:00 AM  Fall Risk   Falls in the past year? 0  Number falls in past yr: 0  Injury with Fall? 0  Risk for fall due to : No Fall Risks  Follow up Falls prevention discussed   Most Recent Depression Screenings:    03/28/2024    3:19 PM 01/05/2023   11:00 AM  PHQ 2/9 Scores  PHQ - 2 Score 0 0   Results:  Studies Obtained And Personally Reviewed By Me:  PAP Smear 03/02/2024 via Gynecologist, Dr. Mills Alma.  Mammogram 03/02/2024 through gyn's office.   Colonoscopy 10/17/2022 removed three sessile 3-7 mm polyps total from rectum and transverse colon (2 sessile serrated polyps w/o cytologic dysplasia and 1 lymphoid aggregate per pathology); found Non-bleeding Internal Hemorrhoids during retroflexion; exam otherwise normal.  Labs:     Component Value Date/Time   NA 139 03/22/2024 0918   K 4.2 03/22/2024 0918   CL 103 03/22/2024 0918   CO2 29 03/22/2024 0918   GLUCOSE 86 03/22/2024 0918   BUN 12  03/22/2024 0918   CREATININE 0.82 03/22/2024 0918   CALCIUM 9.2 03/22/2024 0918   PROT 6.6 03/22/2024 0918   ALBUMIN 4.1 10/30/2016 1040   AST 17 03/22/2024 0918   ALT 15 03/22/2024 0918   ALKPHOS 57 10/30/2016 1040   BILITOT 0.5 03/22/2024 0918   GFRNONAA 85 12/25/2020 0939   GFRAA 99 12/25/2020 0939    Lab Results  Component Value Date   WBC 5.7 03/22/2024   HGB 13.9 03/22/2024   HCT 42.8 03/22/2024   MCV 90.9 03/22/2024   PLT 282 03/22/2024   Lab Results  Component Value Date   CHOL 175 03/22/2024   HDL 67 03/22/2024   LDLCALC 91 03/22/2024   TRIG 83 03/22/2024   CHOLHDL 2.6 03/22/2024    Lab Results  Component Value Date   TSH 1.13 03/22/2024    Assessment & Plan:   Orders Placed This Encounter  Procedures   POCT URINALYSIS DIP (CLINITEK)  Other Labs Reviewed today: CBC: WNL CMP: WNL  TSH: 1.13  Pure Hypercholesterolemia treated with Simvastatin  20 mg daily. 03/22/2024 Lipid Panel WNL.  Vitamin-D Deficiency treated with Vitamin-D 50,000 units weekly.   Spinal Stenosis, L4-5; Low Back Pain treated with Flexeril  10 mg three times daily as needed. Lumbar Stenosis seen on MRI in 08/2018 after being seen by neurology.   Obesity treated with regular exercise and diet management.   Obstructive Sleep Apnea per 2019 sleep study and is not in CPAP  Allergic Rhinitis managed with Flonase, Allegra, Mucinex, and is prescribed an EpiPen as needed. Followed by Allergist, Dr. Almeda Jacobs.   History of HPV; Abnormal PAP Smear, last completed 03/02/2024 via Gynecologist, Dr. Mills Alma.  Mammogram 03/02/2024 also through gyn's office - requesting result records.  Colonoscopy 10/17/2022 removed three sessile 3-7 mm polyps total from rectum and transverse colon (2 sessile serrated  polyps w/o cytologic dysplasia and 1 lymphoid aggregate per pathology); found Non-bleeding Internal Hemorrhoids during retroflexion; exam otherwise normal with repeat recommendation of 2028.  Bone Density  will be due 2043.   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  Vaccine Counseling: Due for Covid-19; UTD on Flu and Tdap. NEGATIVE Hepatitis B and Hepatitis C Screening in 2019.   Annual wellness visit done today including the all of the following: Reviewed patient's Family Medical History Reviewed and updated list of patient's medical providers Assessment of cognitive impairment was done Assessed patient's functional ability Established a written schedule for health screening services Health Risk Assessent Completed and Reviewed  Discussed health benefits of physical activity, and encouraged her to engage in regular exercise appropriate for her age and condition.    I,Emily Lagle,acting as a Neurosurgeon for Sylvan Evener, MD.,have documented all relevant documentation on the behalf of Sylvan Evener, MD,as directed by  Sylvan Evener, MD while in the presence of Sylvan Evener, MD.   I, Sylvan Evener, MD, have reviewed all documentation for this visit. The documentation on 03/29/24 for the exam, diagnosis, procedures, and orders are all accurate and complete.

## 2024-03-22 ENCOUNTER — Other Ambulatory Visit: Payer: Self-pay

## 2024-03-22 DIAGNOSIS — Z0000 Encounter for general adult medical examination without abnormal findings: Secondary | ICD-10-CM

## 2024-03-22 DIAGNOSIS — Z1329 Encounter for screening for other suspected endocrine disorder: Secondary | ICD-10-CM

## 2024-03-22 DIAGNOSIS — E7800 Pure hypercholesterolemia, unspecified: Secondary | ICD-10-CM

## 2024-03-23 LAB — COMPLETE METABOLIC PANEL WITHOUT GFR
AG Ratio: 1.9 (calc) (ref 1.0–2.5)
ALT: 15 U/L (ref 6–29)
AST: 17 U/L (ref 10–35)
Albumin: 4.3 g/dL (ref 3.6–5.1)
Alkaline phosphatase (APISO): 45 U/L (ref 31–125)
BUN: 12 mg/dL (ref 7–25)
CO2: 29 mmol/L (ref 20–32)
Calcium: 9.2 mg/dL (ref 8.6–10.2)
Chloride: 103 mmol/L (ref 98–110)
Creat: 0.82 mg/dL (ref 0.50–0.99)
Globulin: 2.3 g/dL (ref 1.9–3.7)
Glucose, Bld: 86 mg/dL (ref 65–99)
Potassium: 4.2 mmol/L (ref 3.5–5.3)
Sodium: 139 mmol/L (ref 135–146)
Total Bilirubin: 0.5 mg/dL (ref 0.2–1.2)
Total Protein: 6.6 g/dL (ref 6.1–8.1)

## 2024-03-23 LAB — CBC WITH DIFFERENTIAL/PLATELET
Absolute Lymphocytes: 1220 {cells}/uL (ref 850–3900)
Absolute Monocytes: 348 {cells}/uL (ref 200–950)
Basophils Absolute: 51 {cells}/uL (ref 0–200)
Basophils Relative: 0.9 %
Eosinophils Absolute: 63 {cells}/uL (ref 15–500)
Eosinophils Relative: 1.1 %
HCT: 42.8 % (ref 35.0–45.0)
Hemoglobin: 13.9 g/dL (ref 11.7–15.5)
MCH: 29.5 pg (ref 27.0–33.0)
MCHC: 32.5 g/dL (ref 32.0–36.0)
MCV: 90.9 fL (ref 80.0–100.0)
MPV: 10 fL (ref 7.5–12.5)
Monocytes Relative: 6.1 %
Neutro Abs: 4019 {cells}/uL (ref 1500–7800)
Neutrophils Relative %: 70.5 %
Platelets: 282 10*3/uL (ref 140–400)
RBC: 4.71 10*6/uL (ref 3.80–5.10)
RDW: 13 % (ref 11.0–15.0)
Total Lymphocyte: 21.4 %
WBC: 5.7 10*3/uL (ref 3.8–10.8)

## 2024-03-23 LAB — LIPID PANEL
Cholesterol: 175 mg/dL (ref <200)
HDL: 67 mg/dL (ref >=50)
LDL Cholesterol (Calc): 91 mg/dL
Non-HDL Cholesterol (Calc): 108 mg/dL (ref <130)
Total CHOL/HDL Ratio: 2.6 (calc) (ref <5.0)
Triglycerides: 83 mg/dL (ref <150)

## 2024-03-23 LAB — TSH: TSH: 1.13 m[IU]/L

## 2024-03-28 ENCOUNTER — Ambulatory Visit (INDEPENDENT_AMBULATORY_CARE_PROVIDER_SITE_OTHER): Payer: Self-pay | Admitting: Internal Medicine

## 2024-03-28 VITALS — BP 110/70 | HR 83 | Ht 68.0 in | Wt 201.0 lb

## 2024-03-28 DIAGNOSIS — Z8639 Personal history of other endocrine, nutritional and metabolic disease: Secondary | ICD-10-CM

## 2024-03-28 DIAGNOSIS — Z8619 Personal history of other infectious and parasitic diseases: Secondary | ICD-10-CM

## 2024-03-28 DIAGNOSIS — Z0000 Encounter for general adult medical examination without abnormal findings: Secondary | ICD-10-CM | POA: Diagnosis not present

## 2024-03-28 DIAGNOSIS — Z6830 Body mass index (BMI) 30.0-30.9, adult: Secondary | ICD-10-CM | POA: Diagnosis not present

## 2024-03-28 DIAGNOSIS — E7800 Pure hypercholesterolemia, unspecified: Secondary | ICD-10-CM | POA: Diagnosis not present

## 2024-03-28 DIAGNOSIS — R609 Edema, unspecified: Secondary | ICD-10-CM | POA: Diagnosis not present

## 2024-03-28 DIAGNOSIS — M48061 Spinal stenosis, lumbar region without neurogenic claudication: Secondary | ICD-10-CM

## 2024-03-28 DIAGNOSIS — Z8249 Family history of ischemic heart disease and other diseases of the circulatory system: Secondary | ICD-10-CM

## 2024-03-28 DIAGNOSIS — J301 Allergic rhinitis due to pollen: Secondary | ICD-10-CM

## 2024-03-28 DIAGNOSIS — G4730 Sleep apnea, unspecified: Secondary | ICD-10-CM

## 2024-03-28 DIAGNOSIS — Z8742 Personal history of other diseases of the female genital tract: Secondary | ICD-10-CM

## 2024-03-28 DIAGNOSIS — J3081 Allergic rhinitis due to animal (cat) (dog) hair and dander: Secondary | ICD-10-CM

## 2024-03-28 LAB — POCT URINALYSIS DIP (CLINITEK)
Bilirubin, UA: NEGATIVE
Blood, UA: NEGATIVE
Glucose, UA: NEGATIVE mg/dL
Ketones, POC UA: NEGATIVE mg/dL
Leukocytes, UA: NEGATIVE
Nitrite, UA: NEGATIVE
POC PROTEIN,UA: NEGATIVE
Spec Grav, UA: 1.01 (ref 1.010–1.025)
Urobilinogen, UA: 0.2 U/dL
pH, UA: 6.5 (ref 5.0–8.0)

## 2024-03-29 NOTE — Patient Instructions (Signed)
 It was a pleasure to see you today. Labs are all normal. Please continue to work on diet and exercise regimen. Please return in one year or as needed.

## 2024-04-13 DIAGNOSIS — M25512 Pain in left shoulder: Secondary | ICD-10-CM | POA: Insufficient documentation

## 2024-04-22 ENCOUNTER — Ambulatory Visit: Admitting: Podiatry

## 2024-04-22 ENCOUNTER — Encounter: Payer: Self-pay | Admitting: Podiatry

## 2024-04-22 DIAGNOSIS — L84 Corns and callosities: Secondary | ICD-10-CM

## 2024-04-22 NOTE — Progress Notes (Signed)
  Subjective:  Patient ID: [name], female    DOB: 1976/02/17,   MRN: [id number]  Chief Complaint  Patient presents with   Callouses    I want to discuss the callus on my right big toe.  Sometime, it starts to hurt.  Also, my feet stay red under the bottom.  Is that signs of a circulation problem?     48 y.o. female presents for concern of lesion on her right foot great toe. She runs a lot and gets a lot of rubbing and sometimes pain in this area. Wondering how to help this. Also has some concern for redness on the bottom of her feet and wondering if this is a circulation problem.   . Denies any other pedal complaints. Denies n/v/f/c.   Past Medical History:  Diagnosis Date   Allergy    Genital warts    Hyperlipidemia    Low back pain    Nephrolithiasis    Vitamin D  deficiency     Objective:  Physical Exam: Vascular: DP/PT pulses 2/4 bilateral. CFT <3 seconds. Normal hair growth on digits. No edema.  Skin. No lacerations or abrasions bilateral feet. Mild hyperkeratoti lesion noted to medial right hallux. No tenderness to palpation.  Musculoskeletal: MMT 5/5 bilateral lower extremities in DF, PF, Inversion and Eversion. Deceased ROM in DF of ankle joint.  Neurological: Sensation intact to light touch.   Assessment:   1. Callus      Plan:  Patient was evaluated and treated and all questions answered. -Discussed benign skin lesions with patient and treatment options.  -Hyperkeratotic tissue was debrided with chisel without incident as courtesy.  --Discussed venous circulation and suggested compression stockings.  -Encouraged daily moisturizing -Discussed use of pumice stone -Advised good supportive shoes and inserts -Patient to return to office as needed or sooner if condition worsens.   Asberry Failing, DPM

## 2024-05-06 DIAGNOSIS — R29898 Other symptoms and signs involving the musculoskeletal system: Secondary | ICD-10-CM | POA: Insufficient documentation

## 2024-05-11 ENCOUNTER — Other Ambulatory Visit: Payer: Self-pay | Admitting: Internal Medicine

## 2024-05-27 DIAGNOSIS — M7502 Adhesive capsulitis of left shoulder: Secondary | ICD-10-CM | POA: Insufficient documentation

## 2024-06-13 ENCOUNTER — Telehealth: Payer: Self-pay | Admitting: Physician Assistant

## 2024-06-13 DIAGNOSIS — L250 Unspecified contact dermatitis due to cosmetics: Secondary | ICD-10-CM | POA: Diagnosis not present

## 2024-06-13 MED ORDER — HYDROCORTISONE 1 % EX LOTN: 1.0000 | TOPICAL_LOTION | Freq: Two times a day (BID) | CUTANEOUS | 0 refills | Status: AC

## 2024-06-13 MED ORDER — TRIAMCINOLONE ACETONIDE 0.1 % EX OINT: TOPICAL_OINTMENT | Freq: Two times a day (BID) | CUTANEOUS | 0 refills | Status: AC

## 2024-06-13 NOTE — Patient Instructions (Signed)
  Izetta FORBES Presume, thank you for joining Teena Shuck, PA-C for today's virtual visit.  While this provider is not your primary care provider (PCP), if your PCP is located in our provider database this encounter information will be shared with them immediately following your visit.   A [facility] MyChart account gives you access to today's visit and all your visits, tests, and labs performed at [facility]  click here if you don't have a [facility] MyChart account or go to [email or web address]  Consent: (Patient) [name] provided verbal consent for this virtual visit at the beginning of the encounter.  Current Medications:  Current Outpatient Medications:    cyclobenzaprine  (FLEXERIL ) 10 MG tablet, Take 10 mg by mouth 3 (three) times daily as needed for muscle spasms., Disp: , Rfl:    EPINEPHrine 0.3 mg/0.3 mL IJ SOAJ injection, USE AS DIRECTED AS NEEDED FOR ANAPHYLXASIS, Disp: , Rfl:    folic acid  (FOLVITE ) 1 MG tablet, TAKE 1 TABLET(1 MG) BY MOUTH DAILY, Disp: 90 tablet, Rfl: 3   simvastatin  (ZOCOR ) 20 MG tablet, TAKE 1 TABLET(20 MG) BY MOUTH DAILY, Disp: 90 tablet, Rfl: 3   Vitamin D , Ergocalciferol , (DRISDOL ) 1.25 MG (50000 UNIT) CAPS capsule, TAKE 1 CAPSULE BY MOUTH 1 TIME A WEEK, Disp: 12 capsule, Rfl: 4   Medications ordered in this encounter:  No orders of the defined types were placed in this encounter.    *If you need refills on other medications prior to your next appointment, please contact your pharmacy*  Follow-Up: Call back or seek an in-person evaluation if the symptoms worsen or if the condition fails to improve as anticipated.  [facility] [phone]  Other Instructions Please report to the nearest Emergency room with any worsening symptoms. Follow up with primary care provider (PCP) in 2 -3 days.    If you have been instructed to have an in-person evaluation today at a local Urgent Care facility, please use the link  below. It will take you to a list of all of our available [facility], including address, phone number and hours of operation. Please do not delay care.  [facility]  If you or a family member do not have a primary care provider, use the link below to schedule a visit and establish care. When you choose a [facility] primary care physician or advanced practice provider, you gain a long-term partner in health. Find a Primary Care Provider  Learn more about [facility]'s in-office and virtual care options:  - Get Care Now

## 2024-06-13 NOTE — Progress Notes (Signed)
 Virtual Visit Consent   [name], you are scheduled for a virtual visit with a [facility] provider today. Just as with appointments in the office, your consent must be obtained to participate. Your consent will be active for this visit and any virtual visit you may have with one of our providers in the next 365 days. If you have a MyChart account, a copy of this consent can be sent to you electronically.  As this is a virtual visit, video technology does not allow for your provider to perform a traditional examination. This may limit your provider's ability to fully assess your condition. If your provider identifies any concerns that need to be evaluated in person or the need to arrange testing (such as labs, EKG, etc.), we will make arrangements to do so. Although advances in technology are sophisticated, we cannot ensure that it will always work on either your end or our end. If the connection with a video visit is poor, the visit may have to be switched to a telephone visit. With either a video or telephone visit, we are not always able to ensure that we have a secure connection.  By engaging in this virtual visit, you consent to the provision of healthcare and authorize for your insurance to be billed (if applicable) for the services provided during this visit. Depending on your insurance coverage, you may receive a charge related to this service.  I need to obtain your verbal consent now. Are you willing to proceed with your visit today? [name] has provided verbal consent on 06/13/2024 for a virtual visit (video or telephone). Teena Shuck, [place]  Date: 06/13/2024 7:53 PM   Virtual Visit via Video Note   I, Teena Shuck, connected with  [name]  ([id number], January 08, 1976) on 06/13/24 at  7:45 PM EDT by a video-enabled telemedicine application and verified that I am speaking with the correct person using two identifiers.  Location: Patient: Virtual Visit Location Patient:  Home Provider: Virtual Visit Location Provider: Home Office   I discussed the limitations of evaluation and management by telemedicine and the availability of in person appointments. The patient expressed understanding and agreed to proceed.    History of Present Illness: [name] is a 48 y.o. who identifies as a female who was assigned female at birth, and is being seen today for rash.  HPI: Rash This is a new problem. The current episode started yesterday. The problem has been gradually improving since onset. The affected locations include the neck and lips. The rash is characterized by burning. She was exposed to a new detergent/soap. Past treatments include antihistamine. The treatment provided mild relief. Her past medical history is significant for allergies.    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   Genital warts 12/28/2018   Obstructive sleep apnea syndrome 11/04/2017   Obesity, unspecified 04/20/2014   Lumbar disc disease 12/21/2011   History of migraine headaches 12/21/2011   Low back pain 07/24/2011   Vitamin D  deficiency 07/24/2011   History of HPV infection 07/24/2011   Allergic rhinitis 07/24/2011   History of kidney stones 07/24/2011   LEG PAIN, RIGHT 11/13/2008    Allergies:  Allergies  Allergen Reactions   Clarithromycin  Other (See Comments)    Extreme GI upset   Medications:  Current Outpatient Medications:  cyclobenzaprine  (FLEXERIL ) 10 MG tablet, Take 10 mg by mouth 3 (three) times daily as needed for muscle spasms., Disp: , Rfl:    EPINEPHrine 0.3 mg/0.3 mL IJ SOAJ injection, USE AS DIRECTED AS NEEDED FOR ANAPHYLXASIS, Disp: , Rfl:    folic acid  (FOLVITE ) 1 MG tablet, TAKE 1 TABLET(1 MG) BY MOUTH DAILY, Disp: 90 tablet, Rfl: 3   simvastatin  (ZOCOR ) 20 MG tablet, TAKE 1 TABLET(20 MG) BY MOUTH DAILY,  Disp: 90 tablet, Rfl: 3   Vitamin D , Ergocalciferol , (DRISDOL ) 1.25 MG (50000 UNIT) CAPS capsule, TAKE 1 CAPSULE BY MOUTH 1 TIME A WEEK, Disp: 12 capsule, Rfl: 4  Observations/Objective: Patient is well-developed, well-nourished in no acute distress.  Resting comfortably  at home.  Head is normocephalic, atraumatic.  No labored breathing.  Speech is clear and coherent with logical content.  Patient is alert and oriented at baseline.    Assessment and Plan: 1. Contact dermatitis due to cosmetics, unspecified contact dermatitis type (Primary)   Patient presenting for evaluation of rash after new soap. Exam without major abnormalities, - no hypoxia, no tachycardia, no tachypnea, no hypotension. Diagnosis at this time most consistent with allergic reaction. Differential diagnosis considered including anaphylaxis, angioedema, wound, atopic dermatitis, zoster.  These were thought to be less likely given the history, exam, and workup. Monitor for new or worsening symptoms. Patient prescribed topical steroids and advised to use OTC antihistamines. Follow up in one or two days with primary provider or report to ER with worsening symptoms.   Follow Up Instructions: I discussed the assessment and treatment plan with the patient. The patient was provided an opportunity to ask questions and all were answered. The patient agreed with the plan and demonstrated an understanding of the instructions.  A copy of instructions were sent to the patient via MyChart unless otherwise noted below.    The patient was advised to call back or seek an in-person evaluation if the symptoms worsen or if the condition fails to improve as anticipated.    Teena Shuck, PA-C

## 2024-06-14 ENCOUNTER — Encounter: Payer: Self-pay | Admitting: Internal Medicine

## 2024-07-21 ENCOUNTER — Ambulatory Visit: Admitting: Podiatry

## 2024-07-21 DIAGNOSIS — L603 Nail dystrophy: Secondary | ICD-10-CM

## 2024-07-21 NOTE — Progress Notes (Signed)
 Subjective:  Patient ID: [name], female    DOB: 10/28/1975,  MRN: [id number] HPI Chief Complaint  Patient presents with   Nail Problem    Rm6 Nail fungus right foot     48 y.o. female presents with the above complaint.   ROS: Denies fever chills nausea muscle aches pains calf pain back pain chest pain shortness of breath.  Past Medical History:  Diagnosis Date   Allergy    Genital warts    Hyperlipidemia    Low back pain    Nephrolithiasis    Vitamin D  deficiency    Past Surgical History:  Procedure Laterality Date   BASAL CELL CARCINOMA EXCISION  08/2021   forehead   BREAST BIOPSY Right 04/03/2023   US  RT BREAST BX W LOC DEV 1ST LESION IMG BX SPEC US  GUIDE 04/03/2023 [facility]   extraction of wisdom teeth  1999   Vaginal lesion removal  2007    Current Outpatient Medications:    albuterol (VENTOLIN HFA) 108 (90 Base) MCG/ACT inhaler, 1-2 puffs Inhalation every 4-6 hours prn cough/wheeze; Duration: 90 days, Disp: , Rfl:    cyclobenzaprine  (FLEXERIL ) 10 MG tablet, Take 10 mg by mouth 3 (three) times daily as needed for muscle spasms., Disp: , Rfl:    EPINEPHrine 0.3 mg/0.3 mL IJ SOAJ injection, USE AS DIRECTED AS NEEDED FOR ANAPHYLXASIS, Disp: , Rfl:    fexofenadine (ALLEGRA ALLERGY) 180 MG tablet, 1 tablet as needed Orally Once a day; Duration: 30 day(s), Disp: , Rfl:    fluticasone (FLONASE ALLERGY RELIEF) 50 MCG/ACT nasal spray, 1-2 spray in each nostril Nasally Once a day during seasons of difficulty; Duration: 30 days, Disp: , Rfl:    folic acid  (FOLVITE ) 1 MG tablet, TAKE 1 TABLET(1 MG) BY MOUTH DAILY, Disp: 90 tablet, Rfl: 3   simvastatin  (ZOCOR ) 20 MG tablet, TAKE 1 TABLET(20 MG) BY MOUTH DAILY, Disp: 90 tablet, Rfl: 3   Vitamin D , Ergocalciferol , (DRISDOL ) 1.25 MG (50000 UNIT) CAPS capsule, TAKE 1 CAPSULE BY MOUTH 1 TIME A WEEK, Disp: 12 capsule, Rfl: 4  Allergies  Allergen Reactions   Clarithromycin  Other (See Comments)    Extreme GI upset    Review of Systems Objective:  There were no vitals filed for this visit.  General: Well developed, nourished, in no acute distress, alert and oriented x3   Dermatological: Skin is warm, dry and supple bilateral. Nails x 10 are well maintained; remaining integument appears unremarkable at this time. There are no open sores, no preulcerative lesions, no rash or signs of infection present.  Third toenail plate right foot demonstrates distal onycholysis and brown discoloration.  This very well could be nail dystrophy or could be onychomycosis.  Vascular: Dorsalis Pedis artery and Posterior Tibial artery pedal pulses are 2/4 bilateral with immedate capillary fill time. Pedal hair growth present. No varicosities and no lower extremity edema present bilateral.   Neruologic: Grossly intact via light touch bilateral. Vibratory intact via tuning fork bilateral. Protective threshold with Semmes Wienstein monofilament intact to all pedal sites bilateral. Patellar and Achilles deep tendon reflexes 2+ bilateral. No Babinski or clonus noted bilateral.   Musculoskeletal: No gross boney pedal deformities bilateral. No pain, crepitus, or limitation noted with foot and ankle range of motion bilateral. Muscular strength 5/5 in all groups tested bilateral.  Gait: Unassisted, Nonantalgic.    Radiographs:  None taken  Assessment & Plan:   Assessment: Nail dystrophy   Plan: Samples of skin and nail taken today for  pathologic evaluation follow-up with her in 1 month     Stepheny Canal T. [place], [place]

## 2024-07-28 ENCOUNTER — Other Ambulatory Visit: Payer: Self-pay | Admitting: Podiatry

## 2024-08-01 ENCOUNTER — Other Ambulatory Visit: Payer: Self-pay | Admitting: Internal Medicine

## 2024-08-04 ENCOUNTER — Ambulatory Visit: Admitting: Podiatry

## 2024-08-04 DIAGNOSIS — L603 Nail dystrophy: Secondary | ICD-10-CM | POA: Diagnosis not present

## 2024-08-04 MED ORDER — TERBINAFINE HCL 250 MG PO TABS: 250.0000 mg | ORAL_TABLET | Freq: Every day | ORAL | 0 refills | Status: DC

## 2024-08-05 NOTE — Progress Notes (Signed)
 She presents today for follow-up of her lab results regarding her toenails.  Objective: Toenails are unchanged pathology does demonstrate onychomycosis.  Assessment onychomycosis.  Plan: We discussed topical therapy oral therapy and laser therapy.  At this point she has decided that she would like to try oral therapy we did discuss the pros and cons of use this medication possible side effects complication associated with this she understands and is amenable to it.  Start her on terbinafine 250 mg tablets.  She will take 1 tablet daily.  And I will follow-up with her in 1 month for blood work.

## 2024-08-23 ENCOUNTER — Ambulatory Visit: Admitting: Podiatry

## 2024-09-04 ENCOUNTER — Other Ambulatory Visit: Payer: Self-pay | Admitting: Podiatry

## 2024-09-06 ENCOUNTER — Encounter: Payer: Self-pay | Admitting: Podiatry

## 2024-09-08 ENCOUNTER — Ambulatory Visit: Admitting: Podiatry

## 2024-09-08 ENCOUNTER — Encounter: Payer: Self-pay | Admitting: Podiatry

## 2024-09-08 DIAGNOSIS — T7840XA Allergy, unspecified, initial encounter: Secondary | ICD-10-CM | POA: Insufficient documentation

## 2024-09-08 DIAGNOSIS — J3081 Allergic rhinitis due to animal (cat) (dog) hair and dander: Secondary | ICD-10-CM | POA: Insufficient documentation

## 2024-09-08 DIAGNOSIS — L603 Nail dystrophy: Secondary | ICD-10-CM

## 2024-09-08 DIAGNOSIS — J301 Allergic rhinitis due to pollen: Secondary | ICD-10-CM | POA: Insufficient documentation

## 2024-09-08 DIAGNOSIS — Z79899 Other long term (current) drug therapy: Secondary | ICD-10-CM

## 2024-09-08 DIAGNOSIS — R052 Subacute cough: Secondary | ICD-10-CM | POA: Insufficient documentation

## 2024-09-08 DIAGNOSIS — K219 Gastro-esophageal reflux disease without esophagitis: Secondary | ICD-10-CM | POA: Insufficient documentation

## 2024-09-08 DIAGNOSIS — J4520 Mild intermittent asthma, uncomplicated: Secondary | ICD-10-CM | POA: Insufficient documentation

## 2024-09-08 MED ORDER — TERBINAFINE HCL 250 MG PO TABS: 250.0000 mg | ORAL_TABLET | Freq: Every day | ORAL | 0 refills | Status: AC

## 2024-09-09 LAB — COMPREHENSIVE METABOLIC PANEL WITH GFR
AG Ratio: 2 (calc) (ref 1.0–2.5)
ALT: 37 U/L — ABNORMAL HIGH (ref 6–29)
AST: 27 U/L (ref 10–35)
Albumin: 4.2 g/dL (ref 3.6–5.1)
Alkaline phosphatase (APISO): 44 U/L (ref 31–125)
BUN: 16 mg/dL (ref 7–25)
CO2: 29 mmol/L (ref 20–32)
Calcium: 9.6 mg/dL (ref 8.6–10.2)
Chloride: 102 mmol/L (ref 98–110)
Creat: 0.73 mg/dL (ref 0.50–0.99)
Globulin: 2.1 g/dL (ref 1.9–3.7)
Glucose, Bld: 90 mg/dL (ref 65–139)
Potassium: 4.1 mmol/L (ref 3.5–5.3)
Sodium: 138 mmol/L (ref 135–146)
Total Bilirubin: 0.2 mg/dL (ref 0.2–1.2)
Total Protein: 6.3 g/dL (ref 6.1–8.1)
eGFR: 102 mL/min/1.73m2 (ref >=60)

## 2024-09-09 NOTE — Progress Notes (Signed)
 She presents today for follow-up of her first round of Lamisil.  She denies fever chills nausea mobic  muscle aches pains calf pain back pain chest pain shortness of breath.  States that she is doing just fine with it not noticing any change in the nail other than this nail feels a little more symptomatic and appears to be spreading she points to the lesser digit on a picture on her phone.  Objective: No change in physical exam.  Assessment: Long-term therapy with Lamisil for onychomycosis.  Plan: Requesting a CMP for liver and kidney function and dispensed another prescription for 90 tablets of Lamisil.  She will take 1 tablet daily for the next 90 days and I will follow-up with her in 4 months.

## 2024-09-12 ENCOUNTER — Other Ambulatory Visit: Payer: Self-pay

## 2024-09-12 ENCOUNTER — Encounter: Payer: Self-pay | Admitting: Podiatry

## 2024-09-12 ENCOUNTER — Encounter: Payer: Self-pay | Admitting: Internal Medicine

## 2024-09-12 DIAGNOSIS — Z79899 Other long term (current) drug therapy: Secondary | ICD-10-CM

## 2024-09-13 ENCOUNTER — Ambulatory Visit: Admitting: Podiatry

## 2024-09-15 ENCOUNTER — Ambulatory Visit: Admitting: Internal Medicine

## 2024-09-15 ENCOUNTER — Encounter: Payer: Self-pay | Admitting: Internal Medicine

## 2024-09-15 VITALS — BP 108/80 | HR 84 | Ht 68.0 in | Wt 200.0 lb

## 2024-09-15 DIAGNOSIS — R7401 Elevation of levels of liver transaminase levels: Secondary | ICD-10-CM

## 2024-09-15 DIAGNOSIS — B351 Tinea unguium: Secondary | ICD-10-CM

## 2024-09-15 NOTE — Patient Instructions (Addendum)
 CBC with diff and liver panel drawn today. Take terbenafine for 6 more weeks then stop and see if toenail fungus has  resolved . May take several weeks/ months to see this. Mild elevation of ALT can be followed.  ALT Level drawn today.

## 2024-09-15 NOTE — Progress Notes (Addendum)
 Patient Care Team: Perri Ronal PARAS, MD as PCP - General (Internal Medicine)  Visit Date: 09/15/24  Subjective:    Patient ID: [name] , Female   DOB: 1976-08-15, 48 y.o.    MRN: [id number]   49 y.o. Female presents today to discuss mildly elevated ALT. Patient has a past medical history of Vitamin D  deficiency, Spinal stenosis, Obesity, Obstructive sleep apnea.   She was recently prescribed Terbinafine for toe nail fungus by her podiatrist and on her 09/08/2024 CMET her ALT was 37 and she was wondering if she should be concerned.ALT was 15 when checked 5 months ago.She did say the day before the test she had a beer. She has been on Terbinafine since October and was advised take the medication for 4 months. Onychomycosis is improving slowly. Reminded her it takes a long time to  fully grow a toenail- maybe 12-18 months.  No headache or nausea on Terbenafine.   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  Has a college degree.  Never married, but has a steady female partner.  Non-smoker, drinkers 2-4 alcoholic drinks/week.  1 brother.          Fhx:   No Family History of GI Cancers or Colon Polyps   Father (retired emergency planning/management officer), living, w/ hx of Coronary Artery Disease, Heart Attack, and Obesity   Maternal Grandmother w/ hx of Diabetes and Hypertension   Mother (retired engineer, civil (consulting)), living, w/ hx of  Obesity, Diabetes, Hypertension, and Skin Cancer      Review of Systems  All other systems reviewed and are negative.       Objective:   Vitals: BP 108/80   Pulse 84   Ht 5' 8 (1.727 m)   Wt 200 lb (90.7 kg)   LMP 08/30/2024   SpO2 98%   BMI 30.41 kg/m    Physical Exam Vitals reviewed.     Has discoloration of 4th toe nail -see picture  Results:    Labs:       Component Value Date/Time   NA 138 09/08/2024 1626   K 4.1 09/08/2024 1626   CL 102 09/08/2024 1626   CO2 29 09/08/2024 1626   GLUCOSE 90 09/08/2024 1626   BUN 16 09/08/2024 1626   CREATININE 0.73 09/08/2024 1626   CALCIUM 9.6 09/08/2024 1626   PROT 6.3 09/08/2024 1626   ALBUMIN 4.1 10/30/2016 1040   AST 27 09/08/2024 1626   ALT 37 (H) 09/08/2024 1626   ALKPHOS 57 10/30/2016 1040   BILITOT 0.2 09/08/2024 1626   GFRNONAA 85 12/25/2020 0939  GFRAA 99 12/25/2020 0939     Lab Results  Component Value Date   WBC 5.7 03/22/2024   HGB 13.9 03/22/2024   HCT 42.8 03/22/2024   MCV 90.9 03/22/2024   PLT 282 03/22/2024    Lab Results  Component Value Date   CHOL 175 03/22/2024   HDL 67 03/22/2024   LDLCALC 91 03/22/2024   TRIG 83 03/22/2024   CHOLHDL 2.6 03/22/2024     Lab Results  Component Value Date   TSH 1.13 03/22/2024        Assessment & Plan:   Orders Placed This Encounter  Procedures   CBC with Differential/Platelet   Hepatic function panel      Mildly Elevated ALT Measurement: She was recently prescribed Terbinafine for toe nail fungus by her podiatrist and on her 09/08/2024 CMET her ALT was 37 and she was wondering if she should be concerned. She did say the day before the test she had a beer. She has been on Terbinafine since October and was advised by podiatrist take the medication for 4 months.  Liver panel drawn today.  Onychomycosis: Advised to  take Terbinafine for 6 more weeks. Toenail will take some time to grow out.  CBC and Liver panel ordered today. Terbinafine  is generally well tolerated. She will complete 6 more weeks of terbenafine the D/C medication.   I,Makayla C Reid,acting as a scribe for Ronal JINNY Hailstone, MD.,have documented all relevant documentation on the behalf of Ronal JINNY Hailstone, MD,as directed by  Ronal JINNY Hailstone, MD while in the presence of Ronal JINNY Hailstone, MD.   I, Ronal JINNY Hailstone, MD, have reviewed all documentation for this visit. The documentation on 09/15/2024 for the exam, diagnosis, procedures, and orders are all accurate and complete.

## 2024-09-16 ENCOUNTER — Ambulatory Visit (INDEPENDENT_AMBULATORY_CARE_PROVIDER_SITE_OTHER): Payer: Self-pay | Admitting: Internal Medicine

## 2024-09-16 ENCOUNTER — Other Ambulatory Visit: Payer: Self-pay

## 2024-09-16 DIAGNOSIS — R7401 Elevation of levels of liver transaminase levels: Secondary | ICD-10-CM

## 2024-09-18 LAB — CBC WITH DIFFERENTIAL/PLATELET
Absolute Lymphocytes: 1632 {cells}/uL (ref 850–3900)
Absolute Monocytes: 440 {cells}/uL (ref 200–950)
Basophils Absolute: 69 {cells}/uL (ref 0–200)
Basophils Relative: 1.3 %
Eosinophils Absolute: 69 {cells}/uL (ref 15–500)
Eosinophils Relative: 1.3 %
HCT: 41.3 % (ref 35.0–45.0)
Hemoglobin: 13.3 g/dL (ref 11.7–15.5)
MCH: 29.4 pg (ref 27.0–33.0)
MCHC: 32.2 g/dL (ref 32.0–36.0)
MCV: 91.4 fL (ref 80.0–100.0)
MPV: 9.9 fL (ref 7.5–12.5)
Monocytes Relative: 8.3 %
Neutro Abs: 3090 {cells}/uL (ref 1500–7800)
Neutrophils Relative %: 58.3 %
Platelets: 316 Thousand/uL (ref 140–400)
RBC: 4.52 Million/uL (ref 3.80–5.10)
RDW: 12.6 % (ref 11.0–15.0)
Total Lymphocyte: 30.8 %
WBC: 5.3 Thousand/uL (ref 3.8–10.8)

## 2024-09-18 LAB — HEPATIC FUNCTION PANEL
AG Ratio: 1.9 (calc) (ref 1.0–2.5)
ALT: 43 U/L — ABNORMAL HIGH (ref 6–29)
AST: 30 U/L (ref 10–35)
Albumin: 4.3 g/dL (ref 3.6–5.1)
Alkaline phosphatase (APISO): 46 U/L (ref 31–125)
Bilirubin, Direct: 0.1 mg/dL (ref 0.0–0.2)
Globulin: 2.3 g/dL (ref 1.9–3.7)
Indirect Bilirubin: 0.2 mg/dL (ref 0.2–1.2)
Total Bilirubin: 0.3 mg/dL (ref 0.2–1.2)
Total Protein: 6.6 g/dL (ref 6.1–8.1)

## 2024-09-18 LAB — TEST AUTHORIZATION

## 2024-09-18 LAB — GAMMA GT: GGT: 12 U/L (ref 3–55)

## 2024-10-14 LAB — COMPREHENSIVE METABOLIC PANEL WITH GFR
AG Ratio: 2 (calc) (ref 1.0–2.5)
ALT: 25 U/L (ref 6–29)
AST: 17 U/L (ref 10–35)
Albumin: 4.3 g/dL (ref 3.6–5.1)
Alkaline phosphatase (APISO): 46 U/L (ref 31–125)
BUN: 16 mg/dL (ref 7–25)
CO2: 26 mmol/L (ref 20–32)
Calcium: 9.1 mg/dL (ref 8.6–10.2)
Chloride: 104 mmol/L (ref 98–110)
Creat: 0.81 mg/dL (ref 0.50–0.99)
Globulin: 2.2 g/dL (ref 1.9–3.7)
Glucose, Bld: 104 mg/dL (ref 65–139)
Potassium: 4.4 mmol/L (ref 3.5–5.3)
Sodium: 139 mmol/L (ref 135–146)
Total Bilirubin: 0.2 mg/dL (ref 0.2–1.2)
Total Protein: 6.5 g/dL (ref 6.1–8.1)
eGFR: 90 mL/min/1.73m2

## 2024-10-17 ENCOUNTER — Encounter: Payer: Self-pay | Admitting: Internal Medicine

## 2024-10-17 ENCOUNTER — Ambulatory Visit

## 2024-10-17 ENCOUNTER — Encounter: Payer: Self-pay | Admitting: Podiatry

## 2024-10-17 VITALS — BP 110/70 | HR 95 | Ht 68.0 in | Wt 200.0 lb

## 2024-10-17 DIAGNOSIS — Z23 Encounter for immunization: Secondary | ICD-10-CM | POA: Diagnosis not present

## 2024-10-17 NOTE — Progress Notes (Signed)
 Patient presents to the clinic for a flu shot. Patient received a flu shot right deltoid, patient tolerated well.

## 2024-10-18 ENCOUNTER — Ambulatory Visit: Payer: Self-pay | Admitting: Podiatry

## 2024-11-10 ENCOUNTER — Ambulatory Visit: Admitting: Podiatry

## 2024-11-16 ENCOUNTER — Ambulatory Visit: Admitting: Podiatry

## 2024-11-29 ENCOUNTER — Ambulatory Visit: Admitting: Podiatry

## 2024-11-29 ENCOUNTER — Encounter: Payer: Self-pay | Admitting: Podiatry

## 2024-11-29 DIAGNOSIS — L603 Nail dystrophy: Secondary | ICD-10-CM

## 2024-11-29 NOTE — Progress Notes (Addendum)
 She presents today for follow-up of her nail fungus fourth digit right foot.  States that was doing really good and clearing out and now after starting an every other day dose and has started to come back.  She states that her liver enzymes were elevated so we were doing an every other day dose.  She states that she would really like to continue with the every day dose as long as liver enzymes do not continue to go up.  Objective: Onychomycosis fourth digit right foot appears to be worse particularly when compared to pictures that she had on her phone.  Assessment: Onychomycosis fourth digit right foot long-term therapy with Lamisil  slightly elevated liver enzymes.  Plan: Start her back on to her 50 mg tablets of terbinafine  daily.  In 1 month we will send over standing request for comprehensive metabolic panel.  We can also then refill her Lamisil .  She would also like to add laser therapy to her nail treatment plan as well.  She states that she just wants this [place] so that it does not spread to any other part of her body or anyone else in her family.

## 2024-12-05 ENCOUNTER — Ambulatory Visit: Admitting: Podiatry

## 2024-12-06 ENCOUNTER — Ambulatory Visit

## 2025-01-05 ENCOUNTER — Ambulatory Visit: Admitting: Podiatry

## 2025-04-07 ENCOUNTER — Other Ambulatory Visit

## 2025-04-10 ENCOUNTER — Encounter: Admitting: Internal Medicine
